# Patient Record
Sex: Male | Born: 1960 | Race: White | Hispanic: Yes | Marital: Married | Smoking: Current every day smoker
Health system: Southern US, Community
[De-identification: ages and names within clinical notes are randomized; demographics above are authoritative.]

## PROBLEM LIST (undated history)

## (undated) HISTORY — PX: APPENDECTOMY: SHX54

---

## 2005-01-13 ENCOUNTER — Ambulatory Visit: Payer: Self-pay | Admitting: Internal Medicine

## 2005-01-16 ENCOUNTER — Ambulatory Visit: Payer: Self-pay | Admitting: Internal Medicine

## 2006-07-26 ENCOUNTER — Emergency Department: Payer: Self-pay | Admitting: Emergency Medicine

## 2006-07-26 ENCOUNTER — Other Ambulatory Visit: Payer: Self-pay

## 2008-05-09 ENCOUNTER — Emergency Department: Payer: Self-pay | Admitting: Emergency Medicine

## 2012-09-23 ENCOUNTER — Observation Stay: Payer: Self-pay | Admitting: Surgery

## 2012-09-23 LAB — CBC
HCT: 42.5 % (ref 40.0–52.0)
MCHC: 34.2 g/dL (ref 32.0–36.0)
Platelet: 266 10*3/uL (ref 150–440)
RBC: 5.19 10*6/uL (ref 4.40–5.90)
WBC: 13.5 10*3/uL — ABNORMAL HIGH (ref 3.8–10.6)

## 2012-09-23 LAB — URINALYSIS, COMPLETE
Bacteria: NONE SEEN
Bilirubin,UR: NEGATIVE
Blood: NEGATIVE
Glucose,UR: NEGATIVE mg/dL (ref 0–75)
Ketone: NEGATIVE
Leukocyte Esterase: NEGATIVE
Nitrite: NEGATIVE
Specific Gravity: 1.018 (ref 1.003–1.030)
Squamous Epithelial: NONE SEEN
WBC UR: <1 /HPF (ref 0–5)

## 2012-09-23 LAB — LIPASE, BLOOD: Lipase: 93 U/L (ref 73–393)

## 2012-09-23 LAB — COMPREHENSIVE METABOLIC PANEL
BUN: 14 mg/dL (ref 7–18)
Bilirubin,Total: 0.6 mg/dL (ref 0.2–1.0)
Chloride: 107 mmol/L (ref 98–107)
Co2: 25 mmol/L (ref 21–32)
Creatinine: 0.65 mg/dL (ref 0.60–1.30)
EGFR (African American): >60
EGFR (Non-African Amer.): >60
Osmolality: 276 (ref 275–301)
Potassium: 3.8 mmol/L (ref 3.5–5.1)
SGOT(AST): 30 U/L (ref 15–37)
SGPT (ALT): 34 U/L (ref 12–78)
Sodium: 138 mmol/L (ref 136–145)
Total Protein: 8 g/dL (ref 6.4–8.2)

## 2012-09-24 LAB — CBC WITH DIFFERENTIAL/PLATELET
Basophil #: 0 10*3/uL (ref 0.0–0.1)
Eosinophil #: 0 10*3/uL (ref 0.0–0.7)
Eosinophil %: 0.3 %
HCT: 39.7 % — ABNORMAL LOW (ref 40.0–52.0)
HGB: 13.6 g/dL (ref 13.0–18.0)
Lymphocyte %: 13.7 %
MCHC: 34.2 g/dL (ref 32.0–36.0)
Monocyte %: 5.6 %
Neutrophil #: 8.5 10*3/uL — ABNORMAL HIGH (ref 1.4–6.5)
Neutrophil %: 80.1 %
RBC: 4.8 10*6/uL (ref 4.40–5.90)
RDW: 14 % (ref 11.5–14.5)
WBC: 10.6 10*3/uL (ref 3.8–10.6)

## 2012-09-24 LAB — BASIC METABOLIC PANEL
Anion Gap: 7 (ref 7–16)
BUN: 8 mg/dL (ref 7–18)
Chloride: 105 mmol/L (ref 98–107)
Co2: 25 mmol/L (ref 21–32)
Creatinine: 0.81 mg/dL (ref 0.60–1.30)
Potassium: 4 mmol/L (ref 3.5–5.1)
Sodium: 137 mmol/L (ref 136–145)

## 2012-09-27 LAB — PATHOLOGY REPORT

## 2014-08-07 ENCOUNTER — Emergency Department: Payer: Self-pay | Admitting: Emergency Medicine

## 2014-08-22 ENCOUNTER — Ambulatory Visit: Payer: Self-pay | Admitting: Family Medicine

## 2015-02-27 NOTE — H&P (Signed)
PATIENT NAME:  John Robles, John Robles MR#:  [id number] DATE OF BIRTH:  1960-12-27  DATE OF ADMISSION:  09/23/2012  CHIEF COMPLAINT: Left flank pain.   HISTORY OF PRESENT ILLNESS: This is a 54 year old Hispanic male patient who I was asked to see for left flank pain, but a CT is suggestive of appendicitis. Dr. Clemens Catholicagsdale asked me to see the patient, I spoke to her directly. The patient describes the acute onset of left flank pain that started yesterday. The pain was similar but not identical to a history of kidney stones many years ago. He has had no hematuria, no melena or hematochezia, is slightly nauseated but is hungry, and denies any right lower quadrant pain. He has never had an episode like this before and this is slightly similar to but not identical to prior kidney stone disease.   As mentioned, Dr. Clemens Catholicagsdale had done a CT scan with stone protocol and surprisingly showed right lower quadrant inflammation and dilatation of the appendix suggesting appendicitis.   PAST MEDICAL HISTORY: Kidney stones.   PAST SURGICAL HISTORY: None.   ALLERGIES: No known drug allergies.   MEDICATIONS: None.   FAMILY HISTORY: Noncontributory.   SOCIAL HISTORY: The patient does not smoke or drink.  REVIEW OF SYSTEMS: A 10 system review is performed and negative with the exception of that mentioned in the history of present illness.   PHYSICAL EXAMINATION:   GENERAL: Obese, Hispanic male patient.   VITAL SIGNS: Temperature 98.1, pulse 51, respirations 16, blood pressure 131/87, and 99% room air saturation.  HEENT: No scleral icterus.   NECK: No palpable neck nodes.   CHEST: Clear to auscultation.   CARDIAC: Regular rate and rhythm.   ABDOMEN: Soft, nondistended, and minimally tender in the left flank and left lateral abdomen. No right lower quadrant tenderness, no guarding, no rebound, and no percussion tenderness.   LABS/RADIOLOGIC STUDIES: Electrolytes are within normal limits. White blood  cell count is 13.5 and hemoglobin and hematocrit 14 and 42.   CT scan is personally reviewed, suggestive of appendicitis.   ASSESSMENT AND PLAN: This is a patient with left flank pain and CT findings of right lower quadrant acute appendicitis. The physical picture history of this patient does not fit appendicitis; however, the CT scan is quite convincing. He does have a leukocytosis. My plan would be to admit this patient to the hospital and observe him. If his pain complex becomes more suspicious for appendicitis in conjunction with the CT scan, then laparoscopy would be indicated. I will reexamine the patient and hydrate him and start antibiotics at this time because of the leukocytosis.  ____________________________ Adah Salvageichard E. Excell Seltzerooper, MD rec:slb D: 09/23/2012 14:32:45 ET T: 09/23/2012 14:48:51 ET JOB#: [id number]  cc: Adah Salvageichard E. Excell Seltzerooper, MD, <Dictator> Lattie HawICHARD E Alany Borman MD ELECTRONICALLY SIGNED 09/24/2012 14:34

## 2015-02-27 NOTE — Op Note (Signed)
PATIENT NAME:  John Robles [place], John Robles MR#:  [id number] DATE OF BIRTH:  [birth year]  DATE OF PROCEDURE:  09/23/2012  PREOPERATIVE DIAGNOSIS: Acute appendicitis.   POSTOPERATIVE DIAGNOSIS: Acute appendicitis.   PROCEDURE: Laparoscopic appendectomy.   SURGEON: Joash Tony E. Excell Seltzerooper, MD  ANESTHESIA: General with endotracheal tube.   INDICATIONS: This is a patient with progressive right lower quadrant pain and tenderness with signs of peritoneal irritation with leukocytosis and a CT suggesting appendicitis. Preoperatively we discussed rationale for surgery, the options of observation, risks of bleeding, infection, recurrence of symptoms, failure to resolve the symptoms, open procedure and a negative laparoscopy. This was reviewed for him. They understood and agreed to proceed.   FINDINGS: Acute appendicitis, suppurative, nonruptured.   DESCRIPTION OF PROCEDURE: Patient was induced to general anesthesia. He was on IV antibiotics, prepped and draped in sterile fashion. A Foley catheter was placed as well. Local anesthetic was infiltrated in skin and subcutaneous tissues around the periumbilical, incision was made. Veress needle was placed. Pneumoperitoneum was obtained. 5 mm trocar port was placed. The abdominal cavity was explored and under direct vision a 10 mm left lateral port was placed at a 5 mm suprapubic port. The appendix was identified in the right lower quadrant, elevated. The base of the appendix was handled with an Endo GIA and then two firings of the vascular load Endo GIA was fired across the mesoappendix. Specimen was passed out through the lateral port site with the aid of an Endo Catch bag. The area was checked for hemostasis, found to be adequate. There was no sign of bleeding or bile injury therefore the left lateral port site was closed under direct vision with multiple Endo Close technique of 0 Vicryl. Pneumoperitoneum was released. All ports removed. 4-0 subcuticular Monocryl was  used on all skin edges. Steri-Strips, Mastisol, and sterile dressings were placed.    Patient tolerated the procedure well. There were no complications. He was taken to the recovery room in stable condition to be admitted for continued care.   ____________________________ Adah Salvageichard E. Excell Seltzerooper, MD rec:cms D: 09/23/2012 19:20:45 ET T: 09/24/2012 09:36:27 ET JOB#: [id number]  cc: Adah Salvageichard E. Excell Seltzerooper, MD, <Dictator>  Lattie HawICHARD E Sui Kasparek MD ELECTRONICALLY SIGNED 09/24/2012 14:34

## 2015-02-27 NOTE — Discharge Summary (Signed)
PATIENT NAME:  John Robles, John Robles MR#:  [id number] DATE OF BIRTH:  1961/06/03  DATE OF ADMISSION:  09/23/2012 DATE OF DISCHARGE:  09/24/2012  CHIEF COMPLAINT: Right lower quadrant pain.   DIAGNOSIS: Acute appendicitis.   PROCEDURE: Laparoscopic appendectomy.   HISTORY OF PRESENT ILLNESS: This is a patient who presented to the Emergency Room with an unusual history of left flank pain and was having a remote past medical history of kidney stones. A stone protocol CT scan was performed anticipating left-sided kidney stones. Instead classic appendicitis was identified on the right. He was admitted to the hospital for observation and serial examination and throughout the day his pain and abdominal exam became more consistent with appendicitis.   He was taken to the Operating Room where a laparoscopic appendectomy confirmed the presence of acute suppurative appendicitis which was not ruptured. He underwent laparoscopic appendectomy, made an uncomplicated postoperative recovery, is tolerating a regular diet. He has instructions to remove his dressings tomorrow and shower tomorrow, leaving the Steri-Strips in place. He is given Vicodin for pain and instructions to follow up in my office in 10 days.  ____________________________ Adah Salvageichard E. Excell Seltzerooper, MD rec:cms D: 09/24/2012 14:22:59 ET T: 09/25/2012 15:24:48 ET JOB#: [id number]  cc: Adah Salvageichard E. Excell Seltzerooper, MD, <Dictator>  Lattie HawICHARD E Rozalia Dino MD ELECTRONICALLY SIGNED 09/26/2012 12:23

## 2015-07-30 ENCOUNTER — Emergency Department
Admission: EM | Admit: 2015-07-30 | Discharge: 2015-07-30 | Disposition: A | Discharge status: Home / Self Care | Payer: Medicaid Other | Attending: Emergency Medicine | Admitting: Emergency Medicine

## 2015-07-30 ENCOUNTER — Encounter: Payer: Self-pay | Admitting: Emergency Medicine

## 2015-07-30 DIAGNOSIS — Z23 Encounter for immunization: Secondary | ICD-10-CM | POA: Diagnosis not present

## 2015-07-30 DIAGNOSIS — Y998 Other external cause status: Secondary | ICD-10-CM | POA: Insufficient documentation

## 2015-07-30 DIAGNOSIS — Y9289 Other specified places as the place of occurrence of the external cause: Secondary | ICD-10-CM | POA: Diagnosis not present

## 2015-07-30 DIAGNOSIS — S0181XA Laceration without foreign body of other part of head, initial encounter: Secondary | ICD-10-CM

## 2015-07-30 DIAGNOSIS — Z720 Tobacco use: Secondary | ICD-10-CM | POA: Insufficient documentation

## 2015-07-30 DIAGNOSIS — Y9389 Activity, other specified: Secondary | ICD-10-CM | POA: Insufficient documentation

## 2015-07-30 DIAGNOSIS — W228XXA Striking against or struck by other objects, initial encounter: Secondary | ICD-10-CM | POA: Diagnosis not present

## 2015-07-30 MED ORDER — LIDOCAINE HCL (PF) 1 % IJ SOLN: 30.0000 mL | Freq: Once | INTRAMUSCULAR | Status: DC

## 2015-07-30 MED ORDER — LIDOCAINE-EPINEPHRINE (PF) 1 %-1:200000 IJ SOLN
INTRAMUSCULAR | Status: AC
  Filled 2015-07-30: qty 30

## 2015-07-30 MED ORDER — TETANUS-DIPHTH-ACELL PERTUSSIS 5-2.5-18.5 LF-MCG/0.5 IM SUSP
0.5000 mL | Freq: Once | INTRAMUSCULAR | Status: DC
  Filled 2015-07-30: qty 0.5

## 2015-07-30 NOTE — Discharge Instructions (Signed)
Laceracin facial (Facial Laceration)  Una laceracin facial es un corte en el rostro. Estas lesiones pueden ser dolorosas y [place]. Generalmente se curan rpido, pero puede ser necesario un cuidado especial para reducir las cicatrices. DIAGNSTICO  Su mdico realizar la historia clnica, preguntar detalles sobre cmo ocurri la lesin y examinar la herida para determinar cun profundo es el corte. TRATAMIENTO  Algunas laceraciones faciales no requieren sutura. Otras laceraciones quizs no puedan cerrarse debido a un aumento del riesgo de infeccin. El riesgo de infeccin y la probabilidad de que la herida se cierre correctamente dependern de diversos factores, incluido el tiempo transcurrido desde que ocurri la lesin. La herida puede limpiarse para ayudar a prevenir una infeccin. Si la herida se cierra adecuadamente, podrn indicarle analgsicos, si los necesita. Su mdico usar puntos (suturas), pegamento para heridas Sherlyn Lees) o tiras [phone] para la piel para Equities trader. Estos elementos mantendrn unidos los bordes de la piel para que se cure ms rpidamente y para Therapist, music un mejor resultado cosmtico. Si es necesario, es posible que le administren una vacuna contra el ttanos. [place] solo medicamentos de venta libre o recetados, segn las indicaciones del mdico.  Siga las indicaciones de su mdico para el cuidado de la herida. Estas indicaciones variarn segn la tcnica [place] para cerrar la herida. Si tiene suturas:  Mantenga la herida limpia y [place].  Si le colocaron una venda (vendaje), debe cambiarla al menos una vez al da. Adems, cambie el vendaje si este se moja o se ensucia, o segn las instrucciones de su [place] veces por da con agua y [place]. Enjuguela con agua para quitar todo el [place]. Seque dando palmaditas con una toalla limpia y seca.  Despus de limpiar, aplique una delgada capa  del ungento con antibitico recomendado por su mdico. Esto le ayudar a prevenir las infecciones y a Product/process development scientist que el vendaje se Designer, fashion/clothing.  Puede ducharse despus de las primeras 24 horas. No moje la herida hasta que le hayan quitado las suturas.  Qutese las suturas segn las indicaciones de su mdico. Con las laceraciones faciales, las suturas normalmente deben retirarse despus de 4 a 5das para evitar las marcas de los puntos.  Espere algunos [employer] de que le hayan retirado las suturas antes de Clinical cytogeneticist. En caso que tenga tiras [id number] para la piel:  Mantenga la herida limpia y seca.  No deje que las tiras adhesivas se mojen. Puede darse un bao pero asegrese de que la herida se mantenga seca.  Si se moja, squela dando palmaditas con una toalla limpia.  Las tiras caern por s mismas. Puede recortar las tiras a medida que la herida se [place]. No quite las tiras [place] para la piel que an estn adheridas a la herida. Ellas se caern cuando sea el momento. En caso que le hayan aplicado adhesivo:  Podr mojar la herida solo por un memento, en la ducha o el bao. No frote ni sumerja la herida. No practique natacin. Evite transpirar con abundancia hasta que el [place] para la piel se haya cado solo. Despus de ducharse o darse un bao, seque el corte dando palmaditas con una toalla limpia.  No aplique medicamentos lquidos, en crema o ungentos ni maquillaje en su herida mientras el [employer] para la piel est en su lugar. Podr aflojarlo antes de que la herida se cure.  Si tiene un vendaje, tenga cuidado de no aplicar [place]  directamente sobre el adhesivo. Esto puede hacer que el adhesivo se caiga antes de que la herida se haya curado. °· Evite la exposición prolongada a la luz solar o a las lámparas solares mientras el adhesivo para la piel se encuentre en el lugar. °· Por lo general, el adhesivo para la piel permanecerá en su lugar durante 5 a 10 días y luego  caerá naturalmente. No quite la película de adhesivo. °Después de la curación: °Una vez que la herida se haya curado, proteja la herida del sol durante un año, colocando pantalla solar durante el día. Esto puede ayudar a reducir las cicatrices. La exposición a los rayos ultravioletas durante el primer año oscurecerá la cicatriz. Pueden transcurrir entre uno y dos años hasta que la cicatriz se cure completamente y pierda el color rojo.  °SOLICITE ATENCIÓN MÉDICA DE INMEDIATO SI: °· Tiene enrojecimiento, dolor o hinchazón alrededor de la herida. °· Observa una secreción de color blanco amarillento (pus) en la herida. °· Tiene escalofríos o fiebre. °ASEGÚRESE DE QUE: °· Comprende estas instrucciones. °· Controlará su afección. °· Recibirá ayuda de inmediato si no mejora o si empeora. °Document Released: 10/27/2005 Document Revised: 08/17/2013 °ExitCare® Patient Information ©2015 [employer]. This information is not intended to replace advice given to you by your health care provider. Make sure you discuss any questions you have with your health care provider. ° °

## 2015-07-30 NOTE — ED Provider Notes (Signed)
[facility] Emergency Department Provider Note  ____________________________________________  Time seen: On arrival  I have reviewed the triage vital signs and the nursing notes.   HISTORY  Chief Complaint Facial Laceration    HPI John Robles is a 54 y.o. male who presents with a laceration to the left forehead. He was struck by a Merchant navy officer door coming down. No loss of consciousness. No focal deficits. Bleeding controlled. Tetanus not up-to-date    History reviewed. No pertinent past medical history.  There are no active problems to display for this patient.   No past surgical history on file.  No current outpatient prescriptions on file.  Allergies Review of patient's allergies indicates no known allergies.  No family history on file.  Social History Social History  Substance Use Topics  . Smoking status: Current Every Day Smoker  . Smokeless tobacco: None  . Alcohol Use: No    Review of Systems  Constitutional: Negative for fever. Eyes: Negative for visual changes.  Musculoskeletal: Negative for back pain. Skin: Negative for rash. Positive for laceration Neurological: Negative for headaches or focal weakness   ____________________________________________   PHYSICAL EXAM:  VITAL SIGNS: ED Triage Vitals  Enc Vitals Group     BP 07/30/15 1412 112/55 mmHg     Pulse Rate 07/30/15 1412 54     Resp 07/30/15 1412 14     Temp 07/30/15 1412 98.5 F (36.9 C)     Temp Source 07/30/15 1412 Oral     SpO2 07/30/15 1412 95 %     Weight 07/30/15 1412 210 lb (95.255 kg)     Height --      Head Cir --      Peak Flow --      Pain Score 07/30/15 1413 8     Pain Loc --      Pain Edu? --      Excl. in GC? --      Constitutional: Alert and oriented. Well appearing and in no distress. Eyes: Conjunctivae are normal.  ENT   Head: Normocephalic. Patient with 12 c m curvilinear laceration to the left forehead. Bleeding controlled. galea  seems intact   Mouth/Throat: Mucous membranes are moist. Cardiovascular: Normal rate, regular rhythm.  Respiratory: Normal respiratory effort without tachypnea nor retractions.  Gastrointestinal: Soft and non-tender in all quadrants. No distention. There is no CVA tenderness. Musculoskeletal: Nontender with normal range of motion in all extremities. Neurologic:  Normal speech and language. No gross focal neurologic deficits are appreciated. Cranial nerves 2 through 12 are normal Skin:  Skin is warm, dry and intact. No rash noted. Psychiatric: Mood and affect are normal. Patient exhibits appropriate insight and judgment.  ____________________________________________    LABS (pertinent positives/negatives)  Labs Reviewed - No data to display  ____________________________________________     ____________________________________________    RADIOLOGY I have personally reviewed any xrays that were ordered on this patient: None  ____________________________________________   PROCEDURES  Procedure(s) performed: yes  LACERATION REPAIR Performed by: Jene Every Authorized by: Jene Every Consent: Verbal consent obtained. Risks and benefits: risks, benefits and alternatives were discussed Consent given by: patient Patient identity confirmed: provided demographic data Prepped and Draped in normal sterile fashion Wound explored  Laceration Location: Left forehead  Laceration Length: 12 cm  No Foreign Bodies seen or palpated  Anesthesia: local infiltration  Local anesthetic: lidocaine 1 % with epinephrine  Anesthetic total: 6 ml  Irrigation method: syringe Amount of cleaning: standard  Skin closure: Sutures  Number of sutures: 12   Technique: Simple interrupted   Patient tolerance: Patient tolerated the procedure well with no immediate complications.    ____________________________________________   INITIAL IMPRESSION / ASSESSMENT AND PLAN / ED  COURSE  Pertinent labs & imaging results that were available during my care of the patient were reviewed by me and considered in my medical decision making (see chart for details).  Laceration repair by me. No loss of consciousness neuro deficits or headache and no imaging ordered. Tetanus given in the emergency department. Sutures need to be removed in 5-7 days  ____________________________________________   FINAL CLINICAL IMPRESSION(S) / ED DIAGNOSES  Final diagnoses:  Facial laceration, initial encounter     Jene Every, MD 07/30/15 1645

## 2015-07-30 NOTE — ED Notes (Signed)
TDAP given. Wound dressed using non-stick gauze and stretch gauze to wrap. Patient tolerated well

## 2015-07-30 NOTE — ED Notes (Signed)
Today was closing van door and it hit him in forehead.  No loc.  laceration

## 2015-08-04 ENCOUNTER — Emergency Department
Admission: EM | Admit: 2015-08-04 | Discharge: 2015-08-04 | Disposition: A | Discharge status: Home / Self Care | Payer: Medicaid Other | Attending: Emergency Medicine | Admitting: Emergency Medicine

## 2015-08-04 ENCOUNTER — Encounter: Payer: Self-pay | Admitting: Emergency Medicine

## 2015-08-04 DIAGNOSIS — Z4802 Encounter for removal of sutures: Secondary | ICD-10-CM

## 2015-08-04 DIAGNOSIS — Z720 Tobacco use: Secondary | ICD-10-CM | POA: Diagnosis not present

## 2015-08-04 MED ORDER — BACITRACIN ZINC 500 UNIT/GM EX OINT
TOPICAL_OINTMENT | CUTANEOUS | Status: AC
  Filled 2015-08-04: qty 0.9

## 2015-08-04 NOTE — ED Notes (Signed)
Patient here for suture removal. Denies any pain at this time.

## 2015-08-04 NOTE — ED Notes (Signed)
Pt discharged home after verbalizing understanding of discharge instructions; nad noted. 

## 2015-08-04 NOTE — ED Provider Notes (Signed)
[facility] Emergency Department Provider Note  ____________________________________________  Time seen: Approximately 3:54 PM  I have reviewed the triage vital signs and the nursing notes.   HISTORY  Chief Complaint Suture / Staple Removal  HPI John Robles is a 54 y.o. male is here to have sutures removed. He was seen in the emergency room on 9/19 for a laceration to his left forehead. He denies any headaches or visual changes. He  denies any fever or chills.He rates his pain is 1 out of 10.   History reviewed. No pertinent past medical history.  There are no active problems to display for this patient.   Past Surgical History  Procedure Laterality Date  . Appendectomy      No current outpatient prescriptions on file.  Allergies Review of patient's allergies indicates no known allergies.  No family history on file.  Social History Social History  Substance Use Topics  . Smoking status: Current Every Day Smoker -- .2 years  . Smokeless tobacco: None  . Alcohol Use: No    Review of Systems Constitutional: No fever/chills Eyes: No visual changes. Cardiovascular: Denies chest pain. Respiratory: Denies shortness of breath. Gastrointestinal:   No nausea, no vomiting.  Musculoskeletal: Negative for back pain. Skin: Negative for rash. Laceration forehead Neurological: Negative for headaches, focal weakness or numbness. 10-point ROS otherwise negative.  ____________________________________________   PHYSICAL EXAM:  VITAL SIGNS: ED Triage Vitals  Enc Vitals Group     BP 08/04/15 1539 123/67 mmHg     Pulse Rate 08/04/15 1539 66     Resp 08/04/15 1539 18     Temp 08/04/15 1539 98 F (36.7 C)     Temp Source 08/04/15 1539 Oral     SpO2 08/04/15 1539 96 %     Weight 08/04/15 1539 210 lb (95.255 kg)     Height 08/04/15 1539  (1.651 m)     Head Cir --      Peak Flow --      Pain Score 08/04/15 1536 1     Pain Loc --    Pain Edu? --      Excl. in GC? --     Constitutional: Alert and oriented. Well appearing and in no acute distress. Eyes: Conjunctivae are normal. PERRL. EOMI. Head: Atraumatic. Nose: No congestion/rhinnorhea. Neck: No stridor.   Marland KitchenRespiratory: Normal respiratory effort.  No retractions. Gastrointestinal: Soft and nontender. No distention. Musculoskeletal: No lower extremity tenderness nor edema.  No joint effusions. Neurologic:  Normal speech and language. No gross focal neurologic deficits are appreciated. No gait instability. Skin:  Skin is warm, dry and intact. Laceration to left forehead appears to be healing without any signs of infection. Psychiatric: Mood and affect are normal. Speech and behavior are normal.  ____________________________________________   LABS (all labs ordered are listed, but only abnormal results are displayed)  Labs Reviewed - No data to display  PROCEDURES  Procedure(s) performed: None  Critical Care performed: No  ____________________________________________   INITIAL IMPRESSION / ASSESSMENT AND PLAN / ED COURSE  Pertinent labs & imaging results that were available during my care of the patient were reviewed by me and considered in my medical decision making (see chart for details).  Sutures were removed without any difficulty. Patient is return to the emergency room if any urgent concerns. ____________________________________________   FINAL CLINICAL IMPRESSION(S) / ED DIAGNOSES  Final diagnoses:  Encounter for removal of sutures      Tommi Rumps, PA-C  08/04/15 2110  Rockne Menghini, MD 08/17/15 1610

## 2015-08-04 NOTE — ED Notes (Signed)
Patient presents to the ED for suture removal with sutures to his scalp.  Patient states sutures were placed on Monday.  Area appears to be healing well.  Patient reports minimal pain.  No obvious signs of infection.

## 2015-08-04 NOTE — Discharge Instructions (Signed)
Remoción de la sutura, cuidados posteriores °(Suture Removal, Care After) °Siga estas instrucciones durante las próximas semanas. Estas indicaciones le proporcionan información general acerca de cómo deberá cuidarse después del procedimiento. El médico también podrá darle instrucciones más específicas. El tratamiento se ha planificado de acuerdo a las prácticas médicas actuales, pero a veces se producen problemas. Comuníquese con el médico si tiene algún problema o tiene dudas después del procedimiento. °QUÉ ESPERAR DESPUÉS DEL PROCEDIMIENTO °Después de que le retiren los puntos (suturas), es normal experimentar lo siguiente: °· Molestias e hinchazón en la zona de la herida. °· Leve enrojecimiento en la zona de la herida. °INSTRUCCIONES PARA EL CUIDADO EN EL HOGAR  °· Si tiene bandas adhesivas en la piel sobre la zona de la herida, no las retire. Se caerán solas en unos pocos días. Si las bandas adhesivas siguen en su lugar después de 14 días, entonces puede retirarlas. °· Cambie los apósitos (vendajes), al menos, una vez al día o según las instrucciones del médico. Si el vendaje se adhiere, remójelo con agua jabonosa tibia. °· Solo aplique un ungüento o crema según las indicaciones del médico. Si usa crema o ungüento, lave la zona con agua y jabón dos veces por día para quitarlo todo. Enjuague el jabón y seque suavemente la zona con una toalla limpia. °· Mantenga la herida limpia y seca. Si el vendaje se moja, se ensucia o tiene mal olor, cámbielo tan pronto como pueda. °· Continúe protegiendo la herida de lesiones. °· Use protector solar cuando salga al sol. Las cicatrices nuevas se broncean fácilmente. °SOLICITE ATENCIÓN MÉDICA SI: °· Aumenta el enrojecimiento, la hinchazón o el dolor en la zona afectada. °· Observa que sale pus de la herida. °· Tiene fiebre. °· Advierte un olor fétido que proviene de la herida o del vendaje. °· La herida se abre (los bordes se separan). °Document Released: 08/06/2005 Document  Revised: 08/17/2013 °ExitCare® Patient Information ©2015 [employer]. This information is not intended to replace advice given to you by your health care provider. Make sure you discuss any questions you have with your health care provider. ° °

## 2016-05-08 ENCOUNTER — Encounter: Payer: Self-pay | Admitting: Emergency Medicine

## 2016-05-08 ENCOUNTER — Emergency Department: Payer: Self-pay

## 2016-05-08 ENCOUNTER — Emergency Department
Admission: EM | Admit: 2016-05-08 | Discharge: 2016-05-08 | Disposition: A | Discharge status: Home / Self Care | Payer: Self-pay | Attending: Emergency Medicine | Admitting: Emergency Medicine

## 2016-05-08 DIAGNOSIS — F17210 Nicotine dependence, cigarettes, uncomplicated: Secondary | ICD-10-CM | POA: Insufficient documentation

## 2016-05-08 DIAGNOSIS — N281 Cyst of kidney, acquired: Secondary | ICD-10-CM | POA: Insufficient documentation

## 2016-05-08 DIAGNOSIS — R109 Unspecified abdominal pain: Secondary | ICD-10-CM

## 2016-05-08 LAB — BASIC METABOLIC PANEL
Anion gap: 8 (ref 5–15)
BUN: 11 mg/dL (ref 6–20)
CALCIUM: 9.1 mg/dL (ref 8.9–10.3)
CO2: 24 mmol/L (ref 22–32)
CREATININE: 0.77 mg/dL (ref 0.61–1.24)
Chloride: 103 mmol/L (ref 101–111)
GFR calc Af Amer: >60 mL/min (ref >60)
GFR calc non Af Amer: >60 mL/min (ref >60)
GLUCOSE: 93 mg/dL (ref 65–99)
Potassium: 3.7 mmol/L (ref 3.5–5.1)
Sodium: 135 mmol/L (ref 135–145)

## 2016-05-08 LAB — CBC
HCT: 43.9 % (ref 40.0–52.0)
HEMOGLOBIN: 15.1 g/dL (ref 13.0–18.0)
MCH: 28.5 pg (ref 26.0–34.0)
MCHC: 34.5 g/dL (ref 32.0–36.0)
MCV: 82.7 fL (ref 80.0–100.0)
PLATELETS: 256 10*3/uL (ref 150–440)
RBC: 5.31 MIL/uL (ref 4.40–5.90)
RDW: 14.4 % (ref 11.5–14.5)
WBC: 9.5 10*3/uL (ref 3.8–10.6)

## 2016-05-08 LAB — URINALYSIS COMPLETE WITH MICROSCOPIC (ARMC ONLY)
BACTERIA UA: NONE SEEN
Bilirubin Urine: NEGATIVE
GLUCOSE, UA: NEGATIVE mg/dL
Ketones, ur: NEGATIVE mg/dL
Leukocytes, UA: NEGATIVE
Nitrite: NEGATIVE
PH: 5 (ref 5.0–8.0)
Protein, ur: NEGATIVE mg/dL
Specific Gravity, Urine: 1.008 (ref 1.005–1.030)

## 2016-05-08 MED ORDER — NAPROXEN 375 MG PO TABS: 375.0000 mg | ORAL_TABLET | Freq: Two times a day (BID) | ORAL | Status: AC | PRN

## 2016-05-08 MED ORDER — OXYCODONE-ACETAMINOPHEN 5-325 MG PO TABS
ORAL_TABLET | ORAL | Status: DC
Start: 2016-05-08 — End: 2016-05-09
  Filled 2016-05-08: qty 1

## 2016-05-08 MED ORDER — CARISOPRODOL 350 MG PO TABS: 350.0000 mg | ORAL_TABLET | Freq: Three times a day (TID) | ORAL | Status: DC | PRN

## 2016-05-08 MED ORDER — OXYCODONE-ACETAMINOPHEN 5-325 MG PO TABS
1.0000 | ORAL_TABLET | Freq: Once | ORAL | Status: AC
  Administered 2016-05-08: 1 via ORAL

## 2016-05-08 NOTE — ED Provider Notes (Signed)
[facility] Emergency Department Provider Note   ____________________________________________  Time seen: Approximately 8 PM  I have reviewed the triage vital signs and the nursing notes.   HISTORY  Chief Complaint Flank Pain   HPI John Robles is a 55 y.o. male with a history of kidney stones as well as an appendectomy was presented to the emergency department today for left-sided and sudden onset flank pain at 2 PM. He says that he was not lifting and denies any injury. Says that the pain is sharp and is a 6 out of 10. Says that it is a "hard pain." He says that he has had a kidney stone in the past but was many years ago and he says that it was much worse than this. He denies any burning with urination. No nausea, no vomiting or diarrhea. Denies any shortness of breath or chest pain.Says that the pain worsens with movement and twisting of his torso.   History reviewed. No pertinent past medical history.  There are no active problems to display for this patient.   Past Surgical History  Procedure Laterality Date  . Appendectomy      No current outpatient prescriptions on file.  Allergies Review of patient's allergies indicates no known allergies.  History reviewed. No pertinent family history.  Social History Social History  Substance Use Topics  . Smoking status: Current Every Day Smoker -- .2 years    Types: Cigarettes  . Smokeless tobacco: Never Used  . Alcohol Use: No    Review of Systems Constitutional: No fever/chills Eyes: No visual changes. ENT: No sore throat. Cardiovascular: Denies chest pain. Respiratory: Denies shortness of breath. Gastrointestinal: No abdominal pain.  No nausea, no vomiting.  No diarrhea.  No constipation. Genitourinary: Negative for dysuria. Musculoskeletal: Negative for back pain. Skin: Negative for rash. Neurological: Negative for headaches, focal weakness or numbness.  10-point ROS otherwise  negative.  ____________________________________________   PHYSICAL EXAM:  VITAL SIGNS: ED Triage Vitals  Enc Vitals Group     BP 05/08/16 1829 115/68 mmHg     Pulse Rate 05/08/16 1829 67     Resp 05/08/16 1829 20     Temp 05/08/16 1829 98 F (36.7 C)     Temp Source 05/08/16 1829 Oral     SpO2 05/08/16 1829 96 %     Weight 05/08/16 1829 210 lb (95.255 kg)     Height 05/08/16 1829  (1.626 m)     Head Cir --      Peak Flow --      Pain Score 05/08/16 1829 6     Pain Loc --      Pain Edu? --      Excl. in GC? --     Constitutional: Alert and oriented. Well appearing and in no acute distress. Eyes: Conjunctivae are normal. PERRL. EOMI. Head: Atraumatic. Nose: No congestion/rhinnorhea. Mouth/Throat: Mucous membranes are moist.   Neck: No stridor.   Cardiovascular: Normal rate, regular rhythm. Grossly normal heart sounds.  Respiratory: Normal respiratory effort.  No retractions. Lungs CTAB. Gastrointestinal: Soft and nontender. No distention.  Tenderness to palpation over the left lateral flank. No CVA tenderness bilaterally. Musculoskeletal: No lower extremity tenderness nor edema.  No joint effusions. Neurologic:  Normal speech and language. No gross focal neurologic deficits are appreciated. No gait instability. Skin:  Skin is warm, dry and intact. No rash noted. Psychiatric: Mood and affect are normal. Speech and behavior are normal.  ____________________________________________  LABS (all labs ordered are listed, but only abnormal results are displayed)  Labs Reviewed  URINALYSIS COMPLETEWITH MICROSCOPIC ([facility] ONLY) - Abnormal; Notable for the following:    Color, Urine STRAW (*)    APPearance CLEAR (*)    Hgb urine dipstick 1+ (*)    Squamous Epithelial / LPF 0-5 (*)    All other components within normal limits  BASIC METABOLIC PANEL  CBC    ____________________________________________  EKG   ____________________________________________  RADIOLOGY  CT RENAL STONE STUDY (Final result) Result time: 05/08/16 20:15:06   Final result by Rad Results In Interface (05/08/16 20:15:06)   Narrative:   CLINICAL DATA: 55 year old male with left flank pain  EXAM: CT ABDOMEN AND PELVIS WITHOUT CONTRAST  TECHNIQUE: Multidetector CT imaging of the abdomen and pelvis was performed following the standard protocol without IV contrast.  COMPARISON: CT dated 09/23/2012  FINDINGS: Evaluation of this exam is limited in the absence of intravenous contrast.  None the visualized lung bases are clear. No intra-abdominal free air or free fluid.  The liver, gallbladder, pancreas, spleen, adrenal glands appear unremarkable. Multiple bilateral renal hypodense lesions measuring up to 3 cm in the interpolar aspect of the left kidney are not well evaluated but may represent cysts. Nonemergent ultrasound is recommended for further characterization. There is a 7 mm high attenuating parenchymal focus in the upper pole of the right kidney which may represent a complex cyst or a focus of scarring. There is no hydronephrosis or nephrolithiasis on either side. The visualized ureters appear unremarkable. The urinary bladder is predominantly collapsed. There is apparent diffuse thickening of the bladder wall which may be partly related to underdistention. Cystitis is not excluded. Correlation with urinalysis recommended. The prostate and seminal vesicles are grossly unremarkable.  There is sigmoid diverticulosis with muscular hypertrophy. No active inflammatory changes noted. Moderate stool noted within the colon. There is no evidence of bowel obstruction or active inflammation. Appendectomy.  The abdominal aorta and IVC appear grossly unremarkable on this noncontrast study. All there is a retro aortic left renal vein anatomy. No portal  venous gas identified. There is no adenopathy. There is a small fat containing umbilical hernia. The abdominal wall soft tissues are otherwise unremarkable. The osseous structures are intact.  IMPRESSION: No acute intra-abdominal or pelvic pathology. Specifically there is no hydronephrosis or nephrolithiasis.  This bilateral renal hypodense lesions as well as a 7 mm high attenuating right renal upper pole parenchymal focus. Nonemergent ultrasound is recommended for further evaluation.  Sigmoid diverticulosis. No evidence of bowel obstruction or active inflammation.   Electronically Signed By: Elgie CollardArash Radparvar M.D. On: 05/08/2016 20:15       ____________________________________________   PROCEDURES ____________________________________________   INITIAL IMPRESSION / ASSESSMENT AND PLAN / ED COURSE  Pertinent labs & imaging results that were available during my care of the patient were reviewed by me and considered in my medical decision making (see chart for details).  ----------------------------------------- 8:54 PM on 05/08/2016 -----------------------------------------  Patient resting comfortably. He says that his pain is improved after the Percocet. He has what appear to be multiple renal cysts bilaterally measuring up to 3 cm. It is possible that he is having pain from the cysts. However, the sudden onset as well as the reproducibility immediately that it may be musculoskeletal as well. I will give him Naprosyn as well as Soma. I will give him follow-up with primary care as well as urology. He does not have a primary care doctor. He is aware that the  medicine may make him sleepy and will not be driving or operating any heavy machinery while taking the muscle relaxer. He understands the plan and is willing to comply. ____________________________________________   FINAL CLINICAL IMPRESSION(S) / ED DIAGNOSES  Final diagnoses:  Left flank pain  Renal  cysts.    NEW MEDICATIONS STARTED DURING THIS VISIT:  New Prescriptions   No medications on file     Note:  This document was prepared using Dragon voice recognition software and may include unintentional dictation errors.    Myrna Blazeravid Matthew Jory Welke, MD 05/08/16 2055

## 2016-05-08 NOTE — ED Notes (Signed)
Pt c/o L flank pain that started today at approx 1230. Pt states hx of kidney stones. Pt denies urinary symptoms at this time. Describes the pain as "a hard pain" at this time. NAD noted at this time. Respirations even and unlabored at this time.

## 2016-05-08 NOTE — ED Notes (Signed)
H/o kidney stones back in 1996.  Patient describes pain as a "hard pain".  Patient in NAd at this time.

## 2016-05-08 NOTE — Discharge Instructions (Signed)
Dolor en el flanco  °(Flank Pain) °El dolor en el flanco se refiere a aquel dolor que se localiza en un lado del cuerpo, entre la zona superior del abdomen y la espalda. Puede aparecer en un período corto de tiempo (agudo) o puede durar mucho tiempo o ser recurrente (crónico). Puede ser leve o intenso. Puede tener diferentes causas.  °CAUSAS  °Algunas de las causas más comunes son:  °· Esguince muscular.   °· Espasmos musculares.   °· Problemas en la columna vertebral (enfermedad de los discos).   °· Infección en el pulmón (neumonía).   °· Líquido en los pulmones (edema pulmonar).   °· Infección renal.   °· Piedras en el riñón.   °· Una erupción cutánea muy dolorosa causada por el virus de la varicela (herpes zoster). °· Enfermedad de la vesícula biliar. °INSTRUCCIONES PARA EL CUIDADO EN EL HOGAR  °Los cuidados en el hogar dependerán de la causa del dolor. En general:  °· Haga reposo según las indicaciones del médico. °· Debe ingerir gran cantidad de líquido para mantener la orina de tono claro o color amarillo pálido. °· Tome sólo medicamentos de venta libre o recetados, según las indicaciones del médico. Algunos medicamentos ayudan a aliviar el dolor. °· Informe al médico si tiene algún cambio en el dolor. °· Concurra a las visitas de control, según las indicaciones. °SOLICITE ATENCIÓN MÉDICA DE INMEDIATO SI:  °· El dolor no se alivia con los medicamentos.   °· Tiene síntomas nuevos o que empeoran. °· El dolor aumenta.   °· Siente dolor abdominal.   °· Le falta el aire.   °· Tiene náuseas o vómitos persistentes.   °· El abdomen se hincha.   °· Sufre mareos o se desmaya.   °· Observa sangre en la orina. °· Tiene fiebre o síntomas persistentes durante más de 2 ó 3 días. °· Tiene fiebre y los síntomas empeoran repentinamente. °ASEGÚRESE DE QUE:  °· Comprende estas instrucciones. °· Controlará su enfermedad. °· Solicitará ayuda de inmediato si no mejora o si empeora. °  °Esta información no tiene como fin reemplazar  el consejo del médico. Asegúrese de hacerle al médico cualquier pregunta que tenga. °  °Document Released: 02/03/2008 Document Revised: 07/21/2012 °Elsevier Interactive Patient Education ©2016 [employer] Inc[employer] ° °

## 2017-11-28 ENCOUNTER — Other Ambulatory Visit: Payer: Self-pay

## 2017-11-28 ENCOUNTER — Encounter: Payer: Self-pay | Admitting: Emergency Medicine

## 2017-11-28 ENCOUNTER — Emergency Department
Admission: EM | Admit: 2017-11-28 | Discharge: 2017-11-28 | Disposition: A | Discharge status: Home / Self Care | Payer: Self-pay | Attending: Emergency Medicine | Admitting: Emergency Medicine

## 2017-11-28 DIAGNOSIS — Y92009 Unspecified place in unspecified non-institutional (private) residence as the place of occurrence of the external cause: Secondary | ICD-10-CM | POA: Insufficient documentation

## 2017-11-28 DIAGNOSIS — S39012A Strain of muscle, fascia and tendon of lower back, initial encounter: Secondary | ICD-10-CM | POA: Insufficient documentation

## 2017-11-28 DIAGNOSIS — X500XXA Overexertion from strenuous movement or load, initial encounter: Secondary | ICD-10-CM | POA: Insufficient documentation

## 2017-11-28 DIAGNOSIS — F17210 Nicotine dependence, cigarettes, uncomplicated: Secondary | ICD-10-CM | POA: Insufficient documentation

## 2017-11-28 DIAGNOSIS — Y9389 Activity, other specified: Secondary | ICD-10-CM | POA: Insufficient documentation

## 2017-11-28 DIAGNOSIS — Y998 Other external cause status: Secondary | ICD-10-CM | POA: Insufficient documentation

## 2017-11-28 MED ORDER — KETOROLAC TROMETHAMINE 30 MG/ML IJ SOLN
30.0000 mg | Freq: Once | INTRAMUSCULAR | Status: AC
  Administered 2017-11-28: 30 mg via INTRAMUSCULAR
  Filled 2017-11-28: qty 1

## 2017-11-28 MED ORDER — METHOCARBAMOL 500 MG PO TABS
1000.0000 mg | ORAL_TABLET | Freq: Once | ORAL | Status: AC
  Administered 2017-11-28: 1000 mg via ORAL
  Filled 2017-11-28: qty 2

## 2017-11-28 MED ORDER — MELOXICAM 15 MG PO TABS: 15.0000 mg | ORAL_TABLET | Freq: Every day | ORAL | 0 refills | Status: DC

## 2017-11-28 MED ORDER — METHOCARBAMOL 500 MG PO TABS: 500.0000 mg | ORAL_TABLET | Freq: Four times a day (QID) | ORAL | 0 refills | Status: DC

## 2017-11-28 NOTE — ED Notes (Signed)
Pt states picked up something and pulled his low back. Has not taken tylenol or motrin.

## 2017-11-28 NOTE — ED Triage Notes (Signed)
Pt states that he was at home today and was lifting a heavy object and hurt his back around 1500. Pt is ambulatory to triage and in NAD.

## 2017-11-28 NOTE — ED Provider Notes (Signed)
[facility] Emergency Department Provider Note  ____________________________________________  Time seen: Approximately 11:12 PM  I have reviewed the triage vital signs and the nursing notes.   HISTORY  Chief Complaint Back Pain    HPI John Robles is a 57 y.o. male who presents the emergency department for 3-day history of lower back pain.  Patient was doing some work at home, lifting a heavy object and felt a tight sensation in his lower back.  Initially, patient tight/burning sensation in his lower back.  Patient reports that it is worse with movement or twisting.  He denies any radicular symptoms.  No bowel or bladder dysfunction, saddle anesthesia, paresthesias.  No direct trauma.  No history of back problems.  No medications for this complaint prior to arrival.  Patient did try a homemade salve to the area which did not significantly improve symptoms.  History reviewed. No pertinent past medical history.  There are no active problems to display for this patient.   Past Surgical History:  Procedure Laterality Date  . APPENDECTOMY      Prior to Admission medications   Medication Sig Start Date End Date Taking? Authorizing Provider  carisoprodol (SOMA) 350 MG tablet Take 1 tablet (350 mg total) by mouth 3 (three) times daily as needed for muscle spasms. 05/08/16   Schaevitz, Myra Rudeavid Matthew, MD  meloxicam (MOBIC) 15 MG tablet Take 1 tablet (15 mg total) by mouth daily. 11/28/17   Hava Massingale, Delorise RoyalsJonathan D, PA-C  methocarbamol (ROBAXIN) 500 MG tablet Take 1 tablet (500 mg total) by mouth 4 (four) times daily. 11/28/17   Damein Gaunce, Delorise RoyalsJonathan D, PA-C    Allergies Patient has no known allergies.  No family history on file.  Social History Social History   Tobacco Use  . Smoking status: Current Every Day Smoker    Years: 0.20    Types: Cigarettes  . Smokeless tobacco: Never Used  Substance Use Topics  . Alcohol use: No  . Drug use: No     Review  of Systems  Constitutional: No fever/chills Eyes: No visual changes.  Cardiovascular: no chest pain. Respiratory: no cough. No SOB. Gastrointestinal: No abdominal pain.  No nausea, no vomiting.  No diarrhea.  No constipation. Genitourinary: Negative for dysuria. No hematuria Musculoskeletal: Positive for nonradiating lower back pain Skin: Negative for rash, abrasions, lacerations, ecchymosis. Neurological: Negative for headaches, focal weakness or numbness. 10-point ROS otherwise negative.  ____________________________________________   PHYSICAL EXAM:  VITAL SIGNS: ED Triage Vitals  Enc Vitals Group     BP 11/28/17 2105 121/71     Pulse Rate 11/28/17 2105 71     Resp 11/28/17 2105 18     Temp 11/28/17 2105 98.3 F (36.8 C)     Temp Source 11/28/17 2105 Oral     SpO2 11/28/17 2105 95 %     Weight 11/28/17 2106 205 lb (93 kg)     Height 11/28/17 2106 5\' 4"  (1.626 m)     Head Circumference --      Peak Flow --      Pain Score 11/28/17 2103 8     Pain Loc --      Pain Edu? --      Excl. in GC? --      Constitutional: Alert and oriented. Well appearing and in no acute distress. Eyes: Conjunctivae are normal. PERRL. EOMI. Head: Atraumatic. Neck: No stridor.    Cardiovascular: Normal rate, regular rhythm. Normal S1 and S2.  Good peripheral circulation. Respiratory: Normal  respiratory effort without tachypnea or retractions. Lungs CTAB. Good air entry to the bases with no decreased or absent breath sounds. Musculoskeletal: Full range of motion to all extremities. No gross deformities appreciated.  No deformities to spine upon inspection.  Full range of motion to the lumbar spine.  No tenderness to palpation midline spinal processes or bilateral paraspinal muscle groups.  Negative for tenderness to palpation over bilateral sciatic notches.  Negative straight leg raise bilaterally.  Dorsalis pedis pulse intact bilateral lower extremities.  Sensation intact and equal bilateral lower  extremities. Neurologic:  Normal speech and language. No gross focal neurologic deficits are appreciated.  Skin:  Skin is warm, dry and intact. No rash noted. Psychiatric: Mood and affect are normal. Speech and behavior are normal. Patient exhibits appropriate insight and judgement.   ____________________________________________   LABS (all labs ordered are listed, but only abnormal results are displayed)  Labs Reviewed - No data to display ____________________________________________  EKG   ____________________________________________  RADIOLOGY   No results found.  ____________________________________________    PROCEDURES  Procedure(s) performed:    Procedures    Medications  ketorolac (TORADOL) 30 MG/ML injection 30 mg (not administered)  methocarbamol (ROBAXIN) tablet 1,000 mg (not administered)     ____________________________________________   INITIAL IMPRESSION / ASSESSMENT AND PLAN / ED COURSE  Pertinent labs & imaging results that were available during my care of the patient were reviewed by me and considered in my medical decision making (see chart for details).  Review of the [place] CSRS was performed in accordance of the [facility] prior to dispensing any controlled drugs.     Patient's diagnosis is consistent with lumbosacral strain.  Patient with lower back pain after lifting heavy object.  No concerning findings of radicular symptoms, bowel or bladder suction, saddle anesthesia, paresthesias.  No indication at this time for labs or imaging.  Patient is given Toradol muscle relaxer in the emergency department.. Patient will be discharged home with prescriptions for meloxicam Robaxin. Patient is to follow up with primary care as needed or otherwise directed. Patient is given ED precautions to return to the ED for any worsening or new symptoms.     ____________________________________________  FINAL CLINICAL IMPRESSION(S) / ED DIAGNOSES  Final  diagnoses:  Strain of lumbar region, initial encounter      NEW MEDICATIONS STARTED DURING THIS VISIT:  ED Discharge Orders        Ordered    meloxicam (MOBIC) 15 MG tablet  Daily     11/28/17 2250    methocarbamol (ROBAXIN) 500 MG tablet  4 times daily     11/28/17 2250          This chart was dictated using voice recognition software/Dragon. Despite best efforts to proofread, errors can occur which can change the meaning. Any change was purely unintentional.    Racheal Patches, PA-C 11/28/17 2314    Emily Filbert, MD 11/28/17 [phone]

## 2018-08-17 ENCOUNTER — Emergency Department
Admission: EM | Admit: 2018-08-17 | Discharge: 2018-08-17 | Disposition: A | Discharge status: Home / Self Care | Payer: Medicaid Other | Attending: Emergency Medicine | Admitting: Emergency Medicine

## 2018-08-17 ENCOUNTER — Encounter: Payer: Self-pay | Admitting: Emergency Medicine

## 2018-08-17 ENCOUNTER — Emergency Department: Payer: Medicaid Other

## 2018-08-17 DIAGNOSIS — Z79899 Other long term (current) drug therapy: Secondary | ICD-10-CM | POA: Insufficient documentation

## 2018-08-17 DIAGNOSIS — M7732 Calcaneal spur, left foot: Secondary | ICD-10-CM

## 2018-08-17 DIAGNOSIS — F17210 Nicotine dependence, cigarettes, uncomplicated: Secondary | ICD-10-CM | POA: Insufficient documentation

## 2018-08-17 DIAGNOSIS — M25775 Osteophyte, left foot: Secondary | ICD-10-CM | POA: Insufficient documentation

## 2018-08-17 DIAGNOSIS — B353 Tinea pedis: Secondary | ICD-10-CM | POA: Insufficient documentation

## 2018-08-17 MED ORDER — TERBINAFINE HCL 1 % EX CREA: 1.0000 "application " | TOPICAL_CREAM | Freq: Two times a day (BID) | CUTANEOUS | 0 refills | Status: DC

## 2018-08-17 MED ORDER — NAPROXEN 500 MG PO TABS: 500.0000 mg | ORAL_TABLET | Freq: Two times a day (BID) | ORAL | Status: DC

## 2018-08-17 NOTE — ED Notes (Signed)
See triage note  Presents with pain to bottom of left foot for about 3 months  painnis worse in the mornings

## 2018-08-17 NOTE — ED Triage Notes (Signed)
Patient arrives via POV from home with c/o left foot pain x 3 months. Patient denies recent injury or trauma. 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 Patient reports the pain is only when he is up and walking. Patient ambulatory to triage with limping gait.

## 2018-08-17 NOTE — ED Provider Notes (Signed)
[facility] Emergency Department Provider Note   ____________________________________________   First MD Initiated Contact with Patient 08/17/18 1101     (approximate)  I have reviewed the triage vital signs and the nursing notes.   HISTORY  Chief Complaint Foot Pain    HPI John Robles is a 57 y.o. male patient presents with left foot pain for 3 months.  Patient denies provocative incident.  Patient did pain increased with prolonged standing and walking.  Patient is still requires prolonged standing which has increased over the past week.  Patient ambulates with atypical gait.  Patient rates pain a 7/10.  Patient described pain is "aching".  No palliative measure for complaint. History reviewed. No pertinent past medical history.  There are no active problems to display for this patient.   Past Surgical History:  Procedure Laterality Date  . APPENDECTOMY      Prior to Admission medications   Medication Sig Start Date End Date Taking? Authorizing Provider  carisoprodol (SOMA) 350 MG tablet Take 1 tablet (350 mg total) by mouth 3 (three) times daily as needed for muscle spasms. 05/08/16   Schaevitz, Myra Rude, MD  meloxicam (MOBIC) 15 MG tablet Take 1 tablet (15 mg total) by mouth daily. 11/28/17   Cuthriell, Delorise Royals, PA-C  methocarbamol (ROBAXIN) 500 MG tablet Take 1 tablet (500 mg total) by mouth 4 (four) times daily. 11/28/17   Cuthriell, Delorise Royals, PA-C  naproxen (NAPROSYN) 500 MG tablet Take 1 tablet (500 mg total) by mouth 2 (two) times daily with a meal. 08/17/18   Joni Reining, PA-C  terbinafine (LAMISIL AT) 1 % cream Apply 1 application topically 2 (two) times daily. 08/17/18   Joni Reining, PA-C    Allergies Patient has no known allergies.  No family history on file.  Social History Social History   Tobacco Use  . Smoking status: Current Every Day Smoker    Years: 0.20    Types: Cigarettes  . Smokeless tobacco: Never  Used  . Tobacco comment: "once or twice a day"  Substance Use Topics  . Alcohol use: Yes    Comment: "I drink twice a year"  . Drug use: No    Review of Systems Constitutional: No fever/chills Eyes: No visual changes. ENT: No sore throat. Cardiovascular: Denies chest pain. Respiratory: Denies shortness of breath. Gastrointestinal: No abdominal pain.  No nausea, no vomiting.  No diarrhea.  No constipation. Genitourinary: Negative for dysuria. Musculoskeletal: Left plantar foot pain. Skin: Rash bilateral foot Neurological: Negative for headaches, focal weakness or numbness.   ____________________________________________   PHYSICAL EXAM:  VITAL SIGNS: ED Triage Vitals  Enc Vitals Group     BP 08/17/18 1053 118/76     Pulse Rate 08/17/18 1053 60     Resp 08/17/18 1053 18     Temp 08/17/18 1053 98.2 F (36.8 C)     Temp Source 08/17/18 1053 Oral     SpO2 08/17/18 1053 97 %     Weight 08/17/18 1054 208 lb (94.3 kg)     Height 08/17/18 1054 5\' 5"  (1.651 m)     Head Circumference --      Peak Flow --      Pain Score 08/17/18 1054 7     Pain Loc --      Pain Edu? --      Excl. in GC? --    Constitutional: Alert and oriented. Well appearing and in no acute distress. Cardiovascular: Normal rate,  regular rhythm. Grossly normal heart sounds.  Good peripheral circulation. Respiratory: Normal respiratory effort.  No retractions. Lungs CTAB. Gastrointestinal: Soft and nontender. No distention. No abdominal bruits. No CVA tenderness. Musculoskeletal: No obvious deformity.  Patient is moderate guarding palpation plantar aspect of the calcaneus. Skin:  Skin is warm, dry and intact.  Scaly rash bilateral foot.   Psychiatric: Mood and affect are normal. Speech and behavior are normal.  ____________________________________________   LABS (all labs ordered are listed, but only abnormal results are displayed)  Labs Reviewed - No data to  display ____________________________________________  EKG   ____________________________________________  RADIOLOGY  ED MD interpretation:    Official radiology report(s): Dg Foot Complete Left  Result Date: 08/17/2018 CLINICAL DATA:  Left plantar foot pain for the past 3 months. No known injury. Pain is greatest with walking. EXAM: LEFT FOOT - COMPLETE 3+ VIEW COMPARISON:  None. FINDINGS: The bones are subjectively adequately mineralized. There is a plantar calcaneal spur. No acute or healing fracture is observed. There is no lytic nor blastic lesion. The joint spaces are well maintained. The soft tissues exhibit no abnormal swelling. IMPRESSION: Small plantar calcaneal spur. No acute bony abnormalities. The plantar soft tissues are unremarkable. Electronically Signed   By: David  [place] M.D.   On: 08/17/2018 11:42    ____________________________________________   PROCEDURES  Procedure(s) performed: None  Procedures  Critical Care performed: No  ____________________________________________   INITIAL IMPRESSION / ASSESSMENT AND PLAN / ED COURSE  As part of my medical decision making, I reviewed the following data within the electronic MEDICAL RECORD NUMBER   Patient presents with left plantar foot pain for approximately 3 months.  Patient also has a rash consistent with fungal infection of the feet.  X-rays reveal small heel spur.  Patient given discharge care instructions and advised to follow-up with podiatry if no improvement in 2 weeks.  Take medication as directed.       ____________________________________________   FINAL CLINICAL IMPRESSION(S) / ED DIAGNOSES  Final diagnoses:  Heel spur, left  Tinea pedis of both feet     ED Discharge Orders         Ordered    naproxen (NAPROSYN) 500 MG tablet  2 times daily with meals     08/17/18 1155    terbinafine (LAMISIL AT) 1 % cream  2 times daily     08/17/18 1155           Note:  This document was prepared  using Dragon voice recognition software and may include unintentional dictation errors.    Joni Reining, PA-C 08/17/18 1202    Emily Filbert, MD 08/17/18 1344

## 2018-08-17 NOTE — Discharge Instructions (Signed)
Purchase over-the-counter Dr. Margart Sickles heel spur and wear as directed.  Take medication as directed.  Advised to follow-up with podiatry clinic if no improvement in 2 weeks.

## 2018-09-21 ENCOUNTER — Emergency Department
Admission: EM | Admit: 2018-09-21 | Discharge: 2018-09-21 | Disposition: A | Discharge status: Home / Self Care | Payer: No Typology Code available for payment source | Attending: Emergency Medicine | Admitting: Emergency Medicine

## 2018-09-21 ENCOUNTER — Other Ambulatory Visit: Payer: Self-pay

## 2018-09-21 DIAGNOSIS — M7732 Calcaneal spur, left foot: Secondary | ICD-10-CM | POA: Diagnosis not present

## 2018-09-21 DIAGNOSIS — B353 Tinea pedis: Secondary | ICD-10-CM | POA: Diagnosis not present

## 2018-09-21 DIAGNOSIS — F17210 Nicotine dependence, cigarettes, uncomplicated: Secondary | ICD-10-CM | POA: Insufficient documentation

## 2018-09-21 DIAGNOSIS — M79672 Pain in left foot: Secondary | ICD-10-CM | POA: Diagnosis present

## 2018-09-21 DIAGNOSIS — Z79899 Other long term (current) drug therapy: Secondary | ICD-10-CM | POA: Diagnosis not present

## 2018-09-21 MED ORDER — NYSTATIN 100000 UNIT/GM EX CREA: 1.0000 "application " | TOPICAL_CREAM | Freq: Two times a day (BID) | CUTANEOUS | 0 refills | Status: DC

## 2018-09-21 MED ORDER — NYSTATIN 100000 UNIT/GM EX POWD: Freq: Four times a day (QID) | CUTANEOUS | 0 refills | Status: DC

## 2018-09-21 MED ORDER — NAPROXEN 500 MG PO TABS: 500.0000 mg | ORAL_TABLET | Freq: Two times a day (BID) | ORAL | Status: DC

## 2018-09-21 NOTE — ED Provider Notes (Signed)
[facility] Emergency Department Provider Note   ____________________________________________   First MD Initiated Contact with Patient 09/21/18 1104     (approximate)  I have reviewed the triage vital signs and the nursing notes.   HISTORY  Chief Complaint Foot Pain    HPI John Robles is a 57 y.o. male patient returns back for refill of medication for heel spur and tinea pedis.  Patient does not follow up with podiatry as directed.  Patient is not purchase the over-the-counter heel supports as directed.  Patient rates his pain as 8/10.  Patient described the pain is "achy".  History reviewed. No pertinent past medical history.  There are no active problems to display for this patient.   Past Surgical History:  Procedure Laterality Date  . APPENDECTOMY      Prior to Admission medications   Medication Sig Start Date End Date Taking? Authorizing Provider  carisoprodol (SOMA) 350 MG tablet Take 1 tablet (350 mg total) by mouth 3 (three) times daily as needed for muscle spasms. 05/08/16   Schaevitz, Myra Rude, MD  meloxicam (MOBIC) 15 MG tablet Take 1 tablet (15 mg total) by mouth daily. 11/28/17   Cuthriell, Delorise Royals, PA-C  methocarbamol (ROBAXIN) 500 MG tablet Take 1 tablet (500 mg total) by mouth 4 (four) times daily. 11/28/17   Cuthriell, Delorise Royals, PA-C  naproxen (NAPROSYN) 500 MG tablet Take 1 tablet (500 mg total) by mouth 2 (two) times daily with a meal. 08/17/18   Joni Reining, PA-C  naproxen (NAPROSYN) 500 MG tablet Take 1 tablet (500 mg total) by mouth 2 (two) times daily with a meal. 09/21/18   Joni Reining, PA-C  nystatin (MYCOSTATIN/NYSTOP) powder Apply topically 4 (four) times daily. 09/21/18   Joni Reining, PA-C  nystatin cream (MYCOSTATIN) Apply 1 application topically 2 (two) times daily. 09/21/18   Joni Reining, PA-C  terbinafine (LAMISIL AT) 1 % cream Apply 1 application topically 2 (two) times daily. 08/17/18    Joni Reining, PA-C    Allergies Patient has no known allergies.  No family history on file.  Social History Social History   Tobacco Use  . Smoking status: Current Every Day Smoker    Years: 0.20    Types: Cigarettes  . Smokeless tobacco: Never Used  . Tobacco comment: "once or twice a day"  Substance Use Topics  . Alcohol use: Yes    Comment: "I drink twice a year"  . Drug use: No    Review of Systems Constitutional: No fever/chills Eyes: No visual changes. ENT: No sore throat. Cardiovascular: Denies chest pain. Respiratory: Denies shortness of breath. Gastrointestinal: No abdominal pain.  No nausea, no vomiting.  No diarrhea.  No constipation. Genitourinary: Negative for dysuria. Musculoskeletal: Left heel pain. Skin: Fungal rash bilateral foot.  Neurological: Negative for headaches, focal weakness or numbness.   ____________________________________________   PHYSICAL EXAM:  VITAL SIGNS: ED Triage Vitals  Enc Vitals Group     BP 09/21/18 1048 107/63     Pulse Rate 09/21/18 1048 (!) 57     Resp 09/21/18 1048 16     Temp 09/21/18 1048 97.8 F (36.6 C)     Temp Source 09/21/18 1048 Oral     SpO2 09/21/18 1048 100 %     Weight 09/21/18 1049 207 lb 3.7 oz (94 kg)     Height 09/21/18 1049 5\' 4"  (1.626 m)     Head Circumference --  Peak Flow --      Pain Score 09/21/18 1049 8     Pain Loc --      Pain Edu? --      Excl. in GC? --    Constitutional: Alert and oriented. Well appearing and in no acute distress. Cardiovascular: Normal rate, regular rhythm. Grossly normal heart sounds.  Good peripheral circulation. Respiratory: Normal respiratory effort.  No retractions. Lungs CTAB. Neurologic:  Normal speech and language. No gross focal neurologic deficits are appreciated. No gait instability. Skin:  Skin is warm, dry and intact.  Flaky peeling skin bilateral foot.     ____________________________________________   LABS (all labs ordered are listed,  but only abnormal results are displayed)  Labs Reviewed - No data to display ____________________________________________  EKG   ____________________________________________  RADIOLOGY  ED MD interpretation:    Official radiology report(s): No results found.  ____________________________________________   PROCEDURES  Procedure(s) performed: None  Procedures  Critical Care performed: No  ____________________________________________   INITIAL IMPRESSION / ASSESSMENT AND PLAN / ED COURSE  As part of my medical decision making, I reviewed the following data within the electronic MEDICAL RECORD NUMBER    Patient presents for medication refill for heel spur and fungal infection of the feet.  Patient again advised to follow-up with podiatry for definitive evaluation and treatment.      ____________________________________________   FINAL CLINICAL IMPRESSION(S) / ED DIAGNOSES  Final diagnoses:  Heel spur, left  Tinea pedis of both feet     ED Discharge Orders         Ordered    naproxen (NAPROSYN) 500 MG tablet  2 times daily with meals     09/21/18 1121    nystatin cream (MYCOSTATIN)  2 times daily     09/21/18 1121    nystatin (MYCOSTATIN/NYSTOP) powder  4 times daily     09/21/18 1121           Note:  This document was prepared using Dragon voice recognition software and may include unintentional dictation errors.    Joni ReiningSmith, Ridhaan Dreibelbis K, PA-C 09/21/18 1127    Emily FilbertWilliams, Jonathan E, MD 09/24/18 [phone]

## 2018-09-21 NOTE — ED Triage Notes (Signed)
Pt came to ED via pov c/o left foot pain. Reports here for med refill. Unable to tell this RN what medication he needs refilled. HAs x-ray report in his hand. Ambulatory in triage.

## 2018-09-21 NOTE — Discharge Instructions (Signed)
Take medication as directed and follow-up with podiatry °

## 2018-09-21 NOTE — ED Notes (Addendum)
See triage note  Presents with left foot pain  Was seen last month for same  States he lost his discharge papers and did not know who to follow up with  And is out of pain meds

## 2019-06-06 ENCOUNTER — Other Ambulatory Visit: Payer: Self-pay

## 2019-06-06 DIAGNOSIS — Z20822 Contact with and (suspected) exposure to covid-19: Secondary | ICD-10-CM

## 2019-06-08 LAB — NOVEL CORONAVIRUS, NAA: SARS-CoV-2, NAA: NOT DETECTED

## 2020-07-19 ENCOUNTER — Encounter: Payer: Self-pay | Admitting: Emergency Medicine

## 2020-07-19 ENCOUNTER — Emergency Department: Payer: Self-pay

## 2020-07-19 ENCOUNTER — Emergency Department
Admission: EM | Admit: 2020-07-19 | Discharge: 2020-07-19 | Disposition: A | Discharge status: Home / Self Care | Payer: Self-pay | Attending: Emergency Medicine | Admitting: Emergency Medicine

## 2020-07-19 ENCOUNTER — Other Ambulatory Visit: Payer: Self-pay

## 2020-07-19 DIAGNOSIS — M25562 Pain in left knee: Secondary | ICD-10-CM | POA: Insufficient documentation

## 2020-07-19 DIAGNOSIS — F17210 Nicotine dependence, cigarettes, uncomplicated: Secondary | ICD-10-CM | POA: Insufficient documentation

## 2020-07-19 NOTE — ED Provider Notes (Signed)
[facility] Emergency Department Provider Note   ____________________________________________   First MD Initiated Contact with Patient 07/19/20 1339     (approximate)  I have reviewed the triage vital signs and the nursing notes.   HISTORY  Chief Complaint Knee Pain    HPI John Robles is a 59 y.o. male with no stated past medical history who presents for left knee pain for the past 2 months after going dancing.  Patient states that he has had anterior and medial left knee pain that has been intermittent and worsened with walking that he describes as aching, 5/10, nonradiating pain in this area.  Patient has not tried any medications to improve this pain except for topical "Mexican cream" with minimal pain relief.  Patient also states that he has kept this joint wrapped with an Ace wrap.  He does state that this Ace wrap improves his pain the most.  Patient denies any distal weakness/numbness/paresthesia.         History reviewed. No pertinent past medical history.  There are no problems to display for this patient.   Past Surgical History:  Procedure Laterality Date  . APPENDECTOMY      Prior to Admission medications   Medication Sig Start Date End Date Taking? Authorizing Provider  nystatin (MYCOSTATIN/NYSTOP) powder Apply topically 4 (four) times daily. 09/21/18   Joni Reining, PA-C  nystatin cream (MYCOSTATIN) Apply 1 application topically 2 (two) times daily. 09/21/18   Joni Reining, PA-C  terbinafine (LAMISIL AT) 1 % cream Apply 1 application topically 2 (two) times daily. 08/17/18   Joni Reining, PA-C    Allergies Patient has no known allergies.  No family history on file.  Social History Social History   Tobacco Use  . Smoking status: Current Every Day Smoker    Years: 0.20    Types: Cigarettes  . Smokeless tobacco: Never Used  . Tobacco comment: "once or twice a day"  Substance Use Topics  . Alcohol use: Yes      Comment: "I drink twice a year"  . Drug use: No    Review of Systems Constitutional: No fever/chills Eyes: No visual changes. ENT: No sore throat. Cardiovascular: Denies chest pain. Respiratory: Denies shortness of breath. Gastrointestinal: No abdominal pain.  No nausea, no vomiting.  No diarrhea. Genitourinary: Negative for dysuria. Musculoskeletal: Positive for left knee pain Skin: Negative for rash. Neurological: Negative for headaches, weakness/numbness/paresthesias in any extremity Psychiatric: Negative for suicidal ideation/homicidal ideation   ____________________________________________   PHYSICAL EXAM:  VITAL SIGNS: ED Triage Vitals  Enc Vitals Group     BP 07/19/20 1255 130/78     Pulse Rate 07/19/20 1255 (!) 59     Resp 07/19/20 1255 20     Temp 07/19/20 1255 98.5 F (36.9 C)     Temp Source 07/19/20 1255 Oral     SpO2 07/19/20 1255 95 %     Weight 07/19/20 1252 206 lb (93.4 kg)     Height 07/19/20 1252 5\' 4"  (1.626 m)     Head Circumference --      Peak Flow --      Pain Score 07/19/20 1252 5     Pain Loc --      Pain Edu? --      Excl. in GC? --     Constitutional: Alert and oriented. Well appearing and in no acute distress. Eyes: Conjunctivae are normal. PERRL. EOMI. Head: Atraumatic. Nose: No congestion/rhinnorhea. Mouth/Throat: Mucous membranes  are moist.  Oropharynx non-erythematous. Neck: No stridor Cardiovascular: Normal rate, regular rhythm. Grossly normal heart sounds.  Good peripheral circulation. Respiratory: Normal respiratory effort.  No retractions. Lungs CTAB. Gastrointestinal: Soft and nontender. No distention. No abdominal bruits. No CVA tenderness. Musculoskeletal: Left knee with tenderness on varus stress to the joint as well as palpation of the anterior plateau.  No joint effusions. Neurologic:  Normal speech and language. No gross focal neurologic deficits are appreciated. No gait instability. Skin:  Skin is warm, dry and  intact. No rash noted. Psychiatric: Mood and affect are normal. Speech and behavior are normal.  ____________________________________________   LABS (all labs ordered are listed, but only abnormal results are displayed)  Labs Reviewed - No data to display ____________________________________________ ____________________________________  RADIOLOGY  ED MD interpretation: 4 view x-ray of the left knee does not show any evidence of acute fractures or dislocations  Official radiology report(s): DG Knee Complete 4 Views Left  Result Date: 07/19/2020 CLINICAL DATA:  Knee pain for 2 months, no known injury, initial encounter EXAM: LEFT KNEE - COMPLETE 4+ VIEW COMPARISON:  None. FINDINGS: No evidence of fracture, dislocation, or joint effusion. No evidence of arthropathy or other focal bone abnormality. Soft tissues are unremarkable. IMPRESSION: No acute abnormality noted. Electronically Signed   By: Alcide Clever M.D.   On: 07/19/2020 14:38    ____________________________________________   PROCEDURES  Procedure(s) performed (including Critical Care):  Procedures   ____________________________________________   INITIAL IMPRESSION / ASSESSMENT AND PLAN / ED COURSE        Patient presents for left knee pain for the last 2 months Given history, exam and workup I have low suspicion for fracture, dislocation, significant ligamentous injury, septic arthritis, gout flare, new autoimmune arthropathy, or gonococcal arthropathy.  Interventions: 4 view x-ray of the left knee does not show any evidence of acute abnormalities Given pain on palpation of anterior plateau as well as with stress at the joint, concern for possible arthritis versus ligamentous injury.  Patient does not show any laxity of the joint.  Patient agrees with plan for discharge and follow-up with primary care physician if symptoms worsen or do not improve Disposition: Discharge home with strict return precautions and  instructions for prompt primary care follow up in the next week.      ____________________________________________   FINAL CLINICAL IMPRESSION(S) / ED DIAGNOSES  Final diagnoses:  Acute pain of left knee     ED Discharge Orders    None       Note:  This document was prepared using Dragon voice recognition software and may include unintentional dictation errors.   Merwyn Katos, MD 07/19/20 [phone]

## 2020-07-19 NOTE — Discharge Instructions (Addendum)
Por favor, [place] ibuprofen 600mg  cada 8 horas por 5 dias

## 2020-07-19 NOTE — ED Triage Notes (Signed)
Pt reports pain to left knee for 2 months. Pt reports no injuries.

## 2021-04-14 IMAGING — DX DG KNEE COMPLETE 4+V*L*
4 series · 4 of 4 positions shown · non-contrast
Comparison: None.

CLINICAL DATA: Knee pain for 2 months, no known injury, initial
encounter

EXAM:
LEFT KNEE - COMPLETE 4+ VIEW

[knee ap]
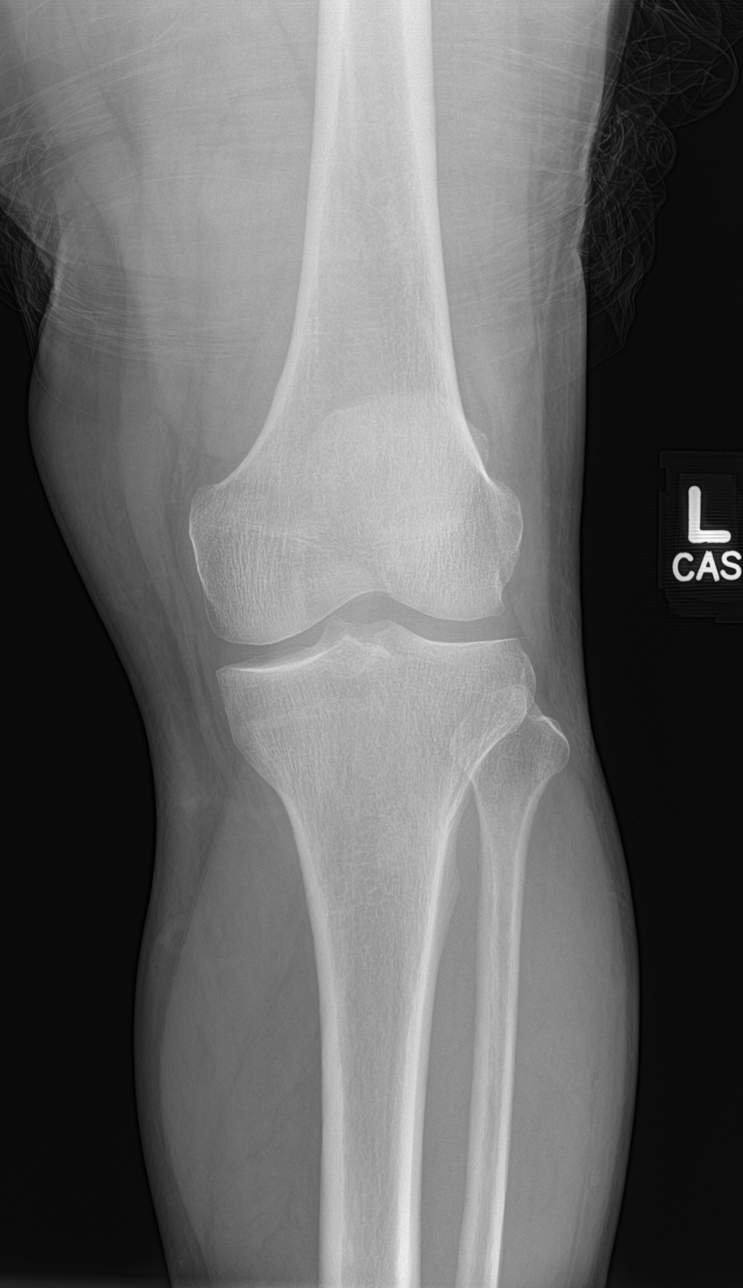

[knee lat]
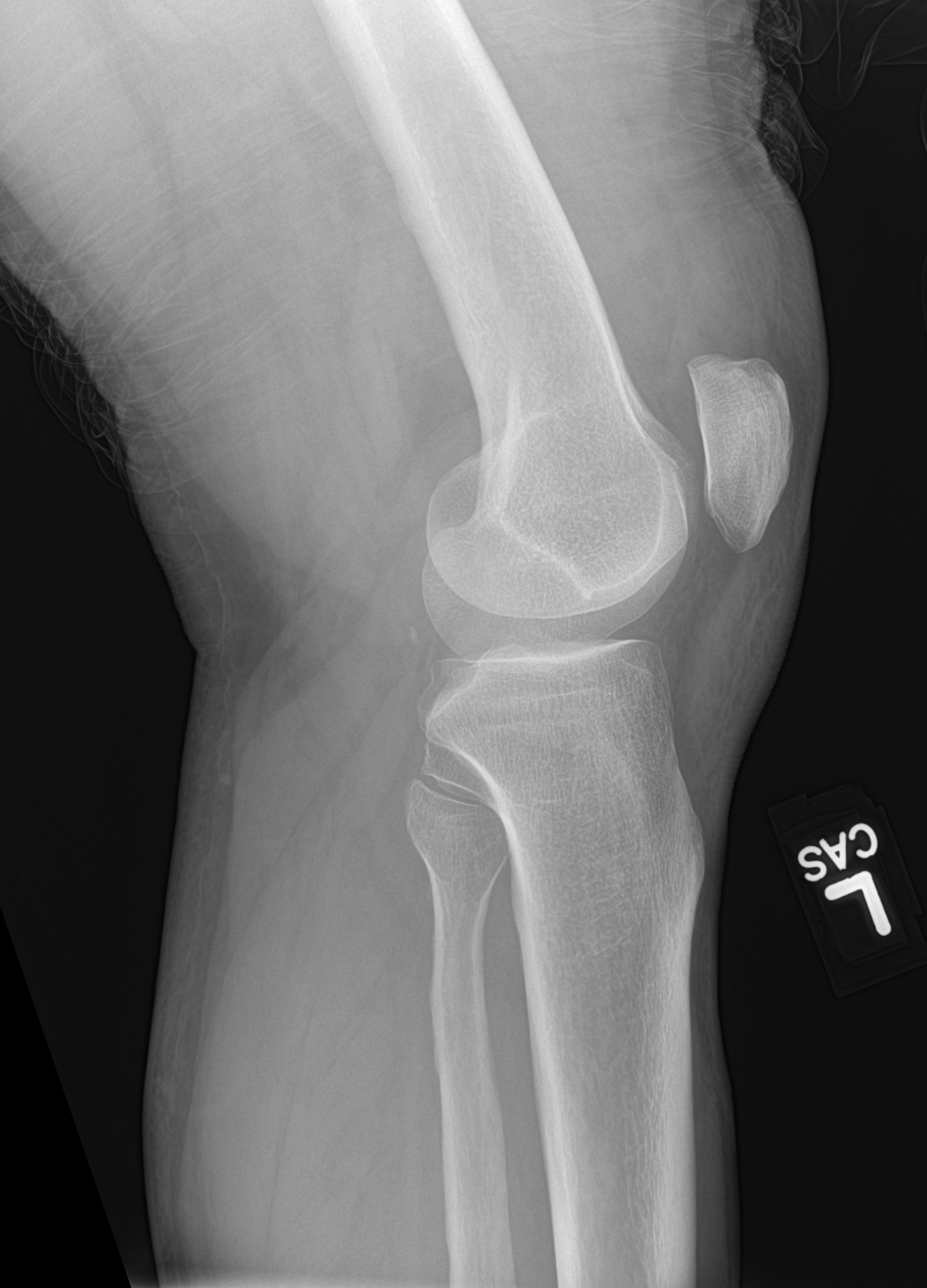

[knee obl (1 of 2)]
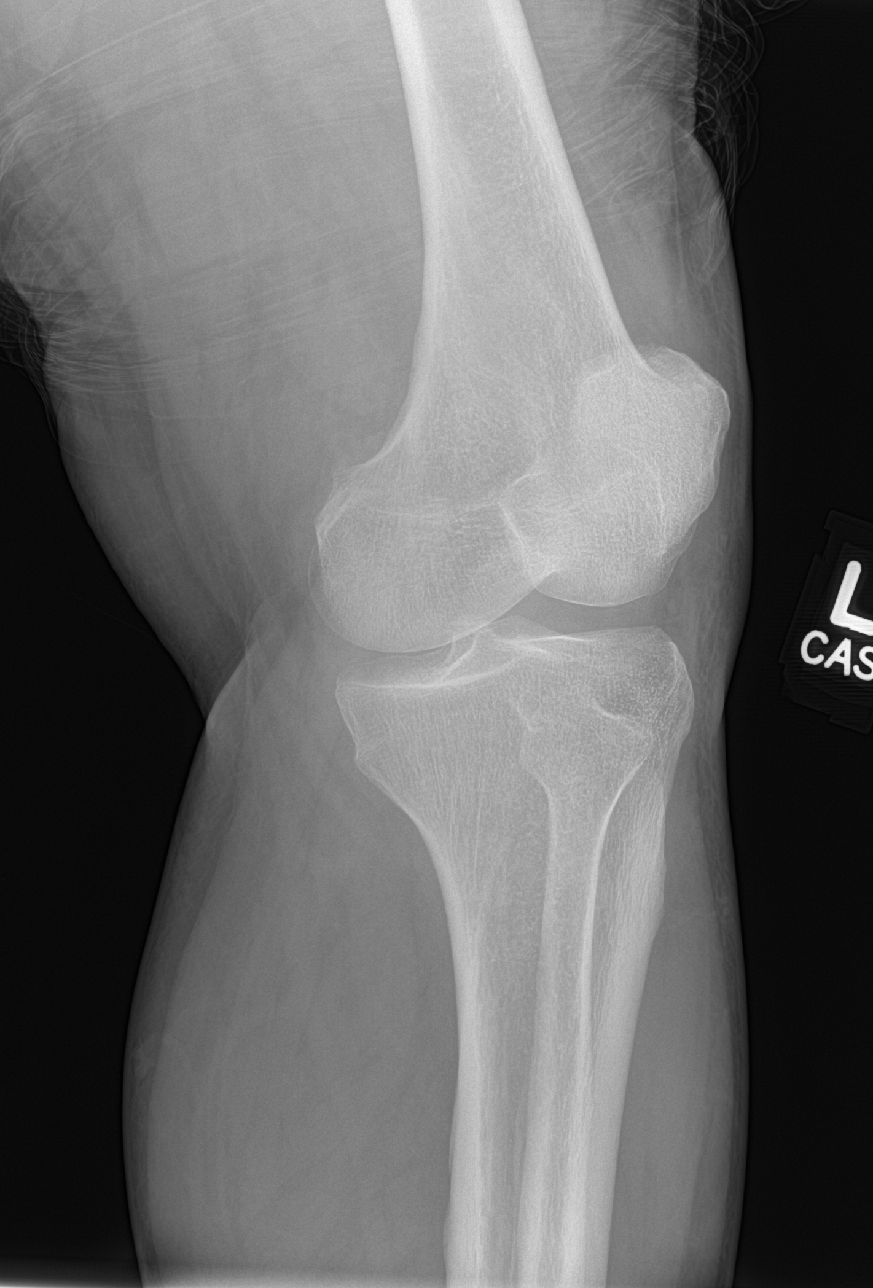

[knee obl (2 of 2)]
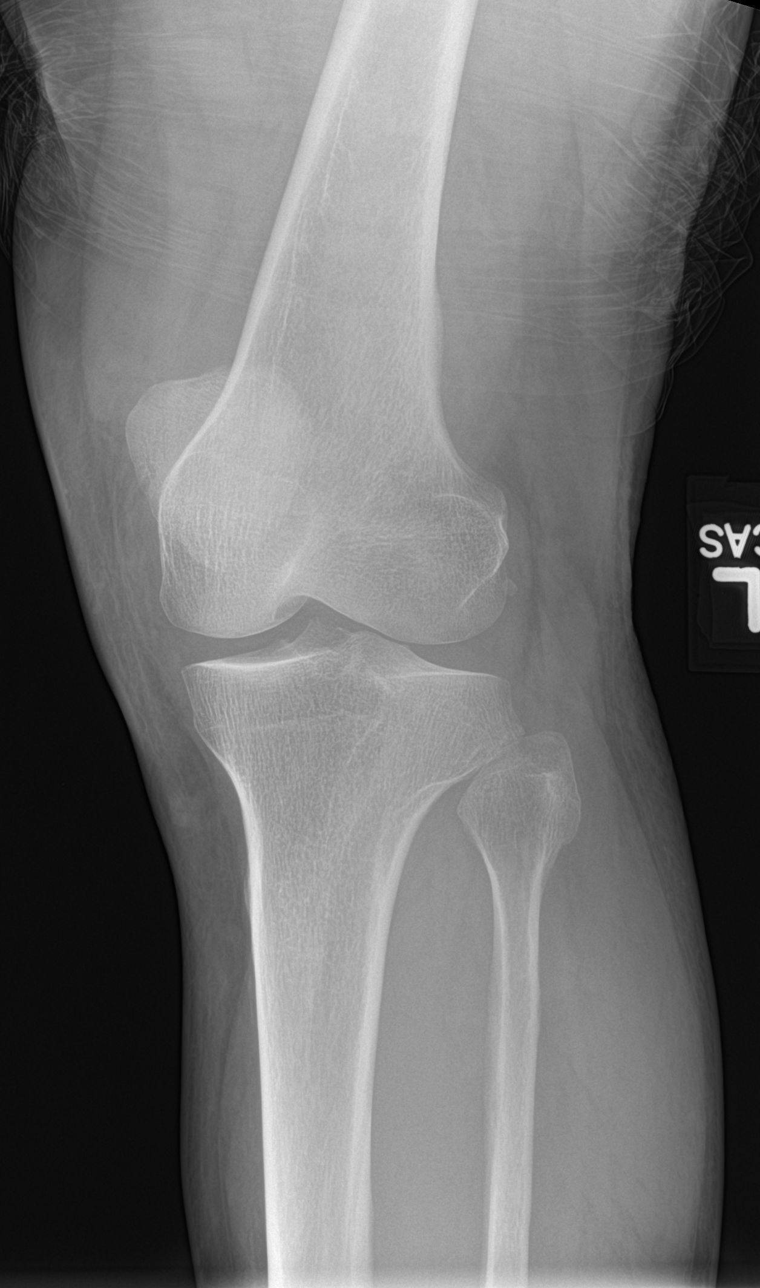

[4 of 4 positions shown; findings below may reference images not displayed]

FINDINGS: No evidence of fracture, dislocation, or joint effusion. No evidence
of arthropathy or other focal bone abnormality. Soft tissues are
unremarkable.
IMPRESSION: No acute abnormality noted.

## 2021-07-23 NOTE — Progress Notes (Signed)
07/26/21 1:51 PM   John Robles [birth year] [id number]  Referring provider:  Deborha Payment, PA-C [address] [place],  [place] [address] Chief Complaint  Patient presents with   Hematuria     HPI: John Robles is a 60 y.o.male who presents today for further evaluation of hematuria.  He is accompanied by a Engineer, structural.    He has a history of nephrolithiasis and had a renal stone study in 04/2016 that revealed a 7 mm high attenuating right renal upper pole parenchymal focus. He has no recent cross- section imaging.   Presented to urgent care for difficulty urinating 07/01/2021 urinalysis showed large blood, trace ketones, 10-50 RBC, and rare bacteria. He had a KUB but results are not viewable. He was given ciprofloxacin for presumed urinary tract infection.   His most recent PSA on 07/01/2021 was 1.47.   He is a smoker.   He is accompanied today by a spanish interpreter. He reports that he has been experiencing leakage for awhile now. He denies any burning. He reports an incident where he urinated on himself after drinking alcohol.   He states he had experienced nausea 3 days before he presented to the urgent care clinic on 07/01/2021.   He reports to have passed a kidney stone about 10 years ago. He believes that his stones are back due to the correlation of symptoms.   He reports that the antibiotics given to him helped with his symptoms.   He is longer having any pain and his urinary symptoms have completely resolved.   PMH: No past medical history on file.  Surgical History: Past Surgical History:  Procedure Laterality Date   APPENDECTOMY      Home Medications:  Allergies as of 07/24/2021   No Known Allergies      Medication List        Accurate as of July 24, 2021 11:59 PM. If you have any questions, ask your nurse or doctor.          STOP taking these medications    nystatin cream Commonly known as: MYCOSTATIN Stopped  by: Vanna Scotland, MD   nystatin powder Commonly known as: MYCOSTATIN/NYSTOP Stopped by: Vanna Scotland, MD   terbinafine 1 % cream Commonly known as: LamISIL AT Stopped by: Vanna Scotland, MD        Allergies: No Known Allergies  Family History: No family history on file.  Social History:  reports that he has been smoking cigarettes. He has never used smokeless tobacco. He reports current alcohol use. He reports that he does not use drugs.   Physical Exam: BP 113/72   Pulse (!) 49   Ht 5\' 5"  (1.651 m)   Wt 209 lb (94.8 kg)   BMI 34.78 kg/m   Constitutional:  Alert and oriented, No acute distress. HEENT:  AT, moist mucus membranes.  Trachea midline, no masses. Cardiovascular: No clubbing, cyanosis, or edema. Respiratory: Normal respiratory effort, no increased work of breathing. Skin: No rashes, bruises or suspicious lesions. Neurologic: Grossly intact, no focal deficits, moving all 4 extremities. Psychiatric: Normal mood and affect.  Laboratory Data:  Lab Results  Component Value Date   CREATININE 0.77 05/08/2016    Urinalysis - Urine negative today, no gross or microscopic blood  Pertinent Imaging: Results for orders placed or performed in visit on 07/24/21  Microscopic Examination   Urine  Result Value Ref Range   WBC, UA 0-5 0 - 5 /hpf   RBC 0-2 0 -  2 /hpf   Epithelial Cells (non renal) 0-10 0 - 10 /hpf   Bacteria, UA None seen None seen/Few  Urinalysis, Complete  Result Value Ref Range   Specific Gravity, UA 1.020 1.005 - 1.030   pH, UA 7.0 5.0 - 7.5   Color, UA Yellow Yellow   Appearance Ur Clear Clear   Leukocytes,UA Negative Negative   Protein,UA Negative Negative/Trace   Glucose, UA Negative Negative   Ketones, UA Negative Negative   RBC, UA Trace (A) Negative   Bilirubin, UA Negative Negative   Urobilinogen, Ur 0.2 0.2 - 1.0 mg/dL   Nitrite, UA Negative Negative   Microscopic Examination See below:     Assessment & Plan:     Microscopic hematuria  - possibly related to recent stone event, symptoms similar to previous stone episodes which have now resolved along with the hematuria.  - offered CT urogram versus observation he wants to pursue surveillance. Will differ CT urogram and cystoscopy for time being but recheck UA in a few months along with recheck of urinary symptoms. - will return for IPSS, PVR, and UA   2. Flank pain  - left lower quadrant  - Antibiotics given at urgent care clinic helped relieve his symptoms  - urinalysis negative no microscopic hematuria  -as above  Return in about 3 months (around 10/23/2021) for IPSS,PVR, UA. Or soner as needed  [employer] as a scribe for Vanna Scotland, MD.,have documented all relevant documentation on the behalf of Vanna Scotland, MD,as directed by  Vanna Scotland, MD while in the presence of Vanna Scotland, MD.  I have reviewed the above documentation for accuracy and completeness, and I agree with the above.   Vanna Scotland, MD   [facility] [address] [place], [place] [address] [phone]

## 2021-07-24 ENCOUNTER — Ambulatory Visit: Payer: Self-pay | Admitting: Urology

## 2021-07-24 ENCOUNTER — Encounter: Payer: Self-pay | Admitting: Urology

## 2021-07-24 ENCOUNTER — Other Ambulatory Visit: Payer: Self-pay

## 2021-07-24 ENCOUNTER — Ambulatory Visit (INDEPENDENT_AMBULATORY_CARE_PROVIDER_SITE_OTHER): Payer: Self-pay | Admitting: Urology

## 2021-07-24 VITALS — BP 113/72 | HR 49 | Ht 65.0 in | Wt 209.0 lb

## 2021-07-24 DIAGNOSIS — R319 Hematuria, unspecified: Secondary | ICD-10-CM

## 2021-07-24 LAB — URINALYSIS, COMPLETE
Bilirubin, UA: NEGATIVE
Glucose, UA: NEGATIVE
Ketones, UA: NEGATIVE
Leukocytes,UA: NEGATIVE
Nitrite, UA: NEGATIVE
Protein,UA: NEGATIVE
Specific Gravity, UA: 1.02 (ref 1.005–1.030)
Urobilinogen, Ur: 0.2 mg/dL (ref 0.2–1.0)
pH, UA: 7 (ref 5.0–7.5)

## 2021-07-24 LAB — MICROSCOPIC EXAMINATION: Bacteria, UA: NONE SEEN

## 2021-07-24 NOTE — Progress Notes (Signed)
See below

## 2021-10-23 ENCOUNTER — Ambulatory Visit: Payer: Medicaid Other | Admitting: Urology

## 2021-10-23 ENCOUNTER — Ambulatory Visit: Payer: Medicaid Other | Admitting: Physician Assistant

## 2021-10-23 NOTE — Progress Notes (Incomplete)
° °  10/23/21 8:30 AM   John Robles 07/08/1961 [id number]  Referring provider:  No referring provider defined for this encounter. No chief complaint on file.    HPI: John Robles is a 60 y.o.male with a personal history of microscopic hematuria, nephrolithiasis, and flank pain, who presents today for 3 month follow-up with IPSS, PVR, and UA.   He had a renal stone study in 04/2016 that revealed a 7 mm high attenuating right renal upper pole parenchymal focus.   His most recent PSA on 07/01/2021 was 1.47.    He is a smoker.   PMH: No past medical history on file.  Surgical History: Past Surgical History:  Procedure Laterality Date   APPENDECTOMY      Home Medications:  Allergies as of 10/23/2021   No Known Allergies      Medication List    as of October 23, 2021  8:30 AM   You have not been prescribed any medications.     Allergies: No Known Allergies  Family History: No family history on file.  Social History:  reports that he has been smoking cigarettes. He has never used smokeless tobacco. He reports current alcohol use. He reports that he does not use drugs.   Physical Exam: There were no vitals taken for this visit.  Constitutional:  Alert and oriented, No acute distress. HEENT: [place] AT, moist mucus membranes.  Trachea midline, no masses. Cardiovascular: No clubbing, cyanosis, or edema. Respiratory: Normal respiratory effort, no increased work of breathing. Skin: No rashes, bruises or suspicious lesions. Neurologic: Grossly intact, no focal deficits, moving all 4 extremities. Psychiatric: Normal mood and affect.  Laboratory Data:  Lab Results  Component Value Date   CREATININE 0.77 05/08/2016    Urinalysis   Pertinent Imaging:    Assessment & Plan:     No follow-ups on file.  I,[name],acting as a scribe for Vanna Scotland, MD.,have documented all relevant documentation on the behalf of Vanna Scotland, MD,as  directed by  Vanna Scotland, MD while in the presence of Vanna Scotland, MD.   [facility] [address], [address] [place], [place] [address] [phone]'

## 2021-11-11 NOTE — Progress Notes (Signed)
11/12/21 10:10 AM   John Robles 10-15-1961 [id number]  Referring provider:  No referring provider defined for this encounter. Chief Complaint  Patient presents with   Hematuria     HPI: John Robles is a 61 y.o.male with a personal history of  microscopic hematuria and flank pain who presents today for 3 month follow-up with IPSS, PVR, and UA for further evaluation of hematuria.   Back in August, he was having some difficulty urinating, had some microscopic hematuria and felt like he was probably passing a kidney stone.  He did not have any imaging at the time.  His symptoms quickly subsided after he was treated for presumed UTI.  He had several repeat urinalysis since that acute episode including when he was seen by me in September, recent ER visit in December and again today all of which show no further microscopic hematuria.  He elected to forego hematuria evaluation.  His most recent PSA on 07/01/2021 was 1.47.   In interim he went to [facility] and was diagnosed with diverticulitis on 10/29/2021 . Urinalysis was negative. CT abdomen and pelvis revealed No hydronephrosis.  Scattered bilateral renal cysts, largest in the left interpolar region measuring up to 3.5 cm, partially exophytic. There are additional low-attenuation lesions, too small to characterize. Acute uncomplicated distal descending/proximal sigmoid colonic diverticulitis.   He is accompanied today by a spanish interpreter. He is interested in prostate health today. He reports that he has conversations with his male friends and they told him he should look into his prostate.    IPSS     Row Name 11/12/21 1000         International Prostate Symptom Score   How often have you had the sensation of not emptying your bladder? Not at All     How often have you had to urinate less than every two hours? Less than half the time     How often have you found you stopped and started again several times  when you urinated? Not at All     How often have you found it difficult to postpone urination? Not at All     How often have you had a weak urinary stream? Not at All     How often have you had to strain to start urination? Less than half the time     How many times did you typically get up at night to urinate? 1 Time     Total IPSS Score 5       Quality of Life due to urinary symptoms   If you were to spend the rest of your life with your urinary condition just the way it is now how would you feel about that? Mostly Satisfied              Score:  1-7 Mild 8-19 Moderate 20-35 Severe  PMH: History reviewed. No pertinent past medical history.  Surgical History: Past Surgical History:  Procedure Laterality Date   APPENDECTOMY      Home Medications:  Allergies as of 11/12/2021   No Known Allergies      Medication List    as of November 12, 2021 10:10 AM   You have not been prescribed any medications.     Allergies: No Known Allergies  Family History: Family History  Problem Relation Age of Onset   Prostate cancer Neg Hx    Testicular cancer Neg Hx    Kidney cancer Neg Hx  Bladder Cancer Neg Hx     Social History:  reports that he has been smoking cigarettes. He has never used smokeless tobacco. He reports current alcohol use. He reports that he does not use drugs.   Physical Exam: BP 111/70    Pulse (!) 50    Ht 5\' 5"  (1.651 m)    Wt 211 lb (95.7 kg)    BMI 35.11 kg/m   Constitutional:  Alert and oriented, No acute distress. HEENT: [place] AT, moist mucus membranes.  Trachea midline, no masses. Cardiovascular: No clubbing, cyanosis, or edema. Respiratory: Normal respiratory effort, no increased work of breathing. Skin: No rashes, bruises or suspicious lesions. Neurologic: Grossly intact, no focal deficits, moving all 4 extremities. Psychiatric: Normal mood and affect.  Laboratory Data:  Lab Results  Component Value Date   CREATININE 0.77 05/08/2016     Urinalysis - Urinalysis showed no blood  Pertinent Imaging: CLINICAL INDICATION: 61 years old with llq abdominal pain     COMPARISON: CT abdomen pelvis 12/26/2005   TECHNIQUE: A helical CT scan of the abdomen and pelvis was obtained following IV contrast from the lung bases through the pubic symphysis. Images were reconstructed in the axial plane. Coronal and sagittal reformatted images were also provided for further evaluation.    FINDINGS:   LOWER CHEST: Minimal atelectasis at the lung bases. No pleural effusions.   LIVER: Normal liver contour.  Subcentimeter low-attenuation lesion in the anterior left lobe of the liver (series 2#28), too small to characterize.   BILIARY: No biliary ductal dilatation.  The gallbladder is normal in appearance.   SPLEEN: Normal in size and contour. Tiny adjacent splenules.   PANCREAS: Normal pancreatic contour.  No focal lesions.  No ductal dilation.   ADRENAL GLANDS: Normal appearance of the adrenal glands.   KIDNEYS/URETERS: Symmetric renal enhancement.  No hydronephrosis.  Scattered bilateral renal cysts, largest in the left interpolar region measuring up to 3.5 cm, partially exophytic. There are additional low-attenuation lesions, too small to characterize.   BLADDER: Unremarkable.   REPRODUCTIVE ORGANS: Mild prostatomegaly measuring up to 5.2 cm   GI TRACT: The stomach appears unremarkable. The loops of small bowel are normal in caliber. There is extensive colonic diverticulosis with a focal area of fat stranding and surrounding inflammatory changes in the left lower quadrant (2:118). No evidence of perforation or adjacent rim-enhancing fluid to suggest abscess formation.   PERITONEUM, RETROPERITONEUM AND MESENTERY: No free air.  No ascites.     LYMPH NODES: No adenopathy.   VESSELS: Hepatic and portal veins are patent.  Normal caliber aorta.  No significant calcified atherosclerotic disease. Retroaortic left renal vein   BONES and  SOFT TISSUES: There are degenerative changes of the spine. Small fat-containing umbilical hernia.   IMPRESSION:  Acute uncomplicated distal descending/proximal sigmoid colonic diverticulitis. Colonoscopy following resolution of acute symptoms can be obtained as clinically indicated to exclude underlying mass lesion.   Additional chronic and incidental findings as noted above.  Results for orders placed or performed in visit on 11/12/21  BLADDER SCAN AMB NON-IMAGING  Result Value Ref Range   Scan Result 0 ml    Assessment & Plan:    Microscopic hematuria  - Recent urinalysis showed no evidence of hematuria. Urinalysis today shows no blood also.  - Likely secondary to previous stone event vs infection which has now resolved; negative urinalysis x3 which is very reassuring -PSA screening up-to-date, no urinary tract symptoms, reassured about his overall GU health especially in light of  the above CT scan which was reviewed  2. Diverticulitis  - Discussed that he should follow-up with his PCP he has yet to do this to further discuss this and colonoscopy.  - Discussed the importance of a colonoscopy due to his age as well as this recent infection. He was given written information about colonoscopy today.   Follow-up as needed if he has any further urinary symptoms, recurrent gross or microscopic hematuria which I would recommend cystoscopy  I,[name],acting as a scribe for Hollice Espy, MD.,have documented all relevant documentation on the behalf of Hollice Espy, MD,as directed by  Hollice Espy, MD while in the presence of Hollice Espy, MD.  I have reviewed the above documentation for accuracy and completeness, and I agree with the above.   Hollice Espy, MD   [facility] [address] [place], [place] [address] [phone]

## 2021-11-12 ENCOUNTER — Encounter: Payer: Self-pay | Admitting: Urology

## 2021-11-12 ENCOUNTER — Ambulatory Visit (INDEPENDENT_AMBULATORY_CARE_PROVIDER_SITE_OTHER): Payer: Self-pay | Admitting: Urology

## 2021-11-12 ENCOUNTER — Other Ambulatory Visit: Payer: Self-pay

## 2021-11-12 VITALS — BP 111/70 | HR 50 | Ht 65.0 in | Wt 211.0 lb

## 2021-11-12 DIAGNOSIS — R319 Hematuria, unspecified: Secondary | ICD-10-CM

## 2021-11-12 LAB — URINALYSIS, COMPLETE
Bilirubin, UA: NEGATIVE
Glucose, UA: NEGATIVE
Ketones, UA: NEGATIVE
Leukocytes,UA: NEGATIVE
Nitrite, UA: NEGATIVE
Protein,UA: NEGATIVE
Specific Gravity, UA: 1.025 (ref 1.005–1.030)
Urobilinogen, Ur: 1 mg/dL (ref 0.2–1.0)
pH, UA: 6.5 (ref 5.0–7.5)

## 2021-11-12 LAB — MICROSCOPIC EXAMINATION
Bacteria, UA: NONE SEEN
Epithelial Cells (non renal): NONE SEEN /hpf (ref 0–10)
WBC, UA: NONE SEEN /hpf (ref 0–5)

## 2021-11-12 LAB — BLADDER SCAN AMB NON-IMAGING: Scan Result: 0

## 2021-11-12 NOTE — Patient Instructions (Signed)
Colonoscopa en los adultos Colonoscopy, Adult [place] colonoscopa es un examen que se realiza para examinar el intestino grueso. Se realiza [employer] de un tubo [place], fino y flexible que tiene una cmara en el extremo. Este examen se realiza para detectar si hay problemas, por ejemplo: Un crecimiento anormal de clulas o tejido (tumor). Crecimientos anormales dentro del revestimiento del intestino (plipos). Irritacin e hinchazn(inflamacin). Sangrado. Consulte al mdico acerca de lo siguiente: Cualquier alergia que tenga. Todos los [employer] toma. Infrmele sobre vitaminas, hierbas, gotas oftlmicas, cremas y [address] de [address]. Problemas previos que usted o algn miembro de su familia hayan tenido con los anestsicos. Cualquier problema de la sangre que tenga. Cirugas a las que se haya sometido. Cualquier afeccin mdica que tenga. Cualquier problema que haya tenido al defecar ( deposiciones). Si est embarazada o podra estarlo. Cules son los riesgos? En general, se trata de un procedimiento seguro. Sin embargo, pueden presentarse problemas, por ejemplo: Sangrado. Dao intestinal. Reacciones alrgicas a los medicamentos administrados durante el procedimiento. Infeccin. Esto es poco frecuente. Qu ocurre antes del procedimiento? Comida y bebida Siga las instrucciones del mdico respecto de las comidas y las bebidas. Pueden incluir: [employer] del procedimiento: Siga una dieta con bajo contenido de [place]. Evite estos alimentos: Frutos secos. Semillas. Frutas pasas. Frutas crudas. Vegetales. Entre 1 y 3 das antes del procedimiento: Coma solo postre de gelatina o helados de [place]. Beba solamente lquidos transparentes, por ejemplo: Agua. Caldos o sopas transparentes. Caf negro o t. Jugos transparentes. Refrescos o bebidas deportivas transparentes. No beba lquidos con colorante rojo o morado. El da del procedimiento: No coma alimentos  slidos. Puede continuar bebiendo lquidos claros hasta 2 horas antes del procedimiento. No coma ni beba nada a partir de 2 horas antes al procedimiento o segn le haya indicado el mdico. Preparado intestinal Si le recetaron un preparado intestinal para tomar por la boca (por va oral) para limpiar el colon: Tmelo como se lo haya indicado el mdico. A partir del da anterior al procedimiento, tendr que beber una gran cantidad de un medicamento lquido. El lquido lo har defecar hasta que la materia fecal sea casi transparente o de color verde claro. Si la piel o la zona anal se le irritan debido a Technical sales engineer, Scientist, product/process development lo siguiente: Limpie la zona con toallitas que contengan productos medicinales, por ejemplo, toallitas hmedas para adultos con aloe y vitamina E. Aplquese un producto en la piel que suavice la zona, como vaselina. Si vomita mientras toma el preparado intestinal: Haga una pausa durante 60 minutos como mximo. Comience a tomar el preparado nuevamente. Llame al mdico si sigue vomitando y no puede tomar el preparado intestinal sin vomitar. Para limpiarle el colon, tambin pueden darle: Medicamentos laxantes. Estos ayudan a defecar. Instrucciones para el uso de un medicamento lquido (enema) inyectado en el ano. Medicamentos Consulte al mdico si debe cambiar o suspender: Sus medicamentos habituales. Vitaminas, hierbas y suplementos. Medicamentos de [employer]. No tome aspirina ni ibuprofeno a menos que se lo indiquen. Instrucciones generales Pregntele al mdico qu medidas se tomarn para evitar la propagacin de grmenes. Estas pueden incluir lavar la piel con un jabn para eliminar los grmenes. Si va a marcharse a su casa inmediatamente despus del procedimiento, pdale a un adulto responsable que: Lo lleve a su casa desde el hospital o la clnica. No se le permitir conducir. Lo cuide durante el [employer] indiquen. Qu ocurre durante el procedimiento?  Se le  colocar  un tubo (catter) intravenoso en una vena. Es posible que le administren lo siguiente: Un sedante. Este medicamento ayuda a Lexicographer. Anestsicos. Estos medicamentos: Adormecen ciertas zonas del cuerpo. Hacen que se duerma durante el procedimiento. Esto debe realizarse en contadas ocasiones. Se recostar de costado con las rodillas flexionadas. Se colocar aceite o gel sobre el tubo. Luego, se har lo siguiente con el tubo: Se colocar en la abertura del ano. Se introducir suavemente en el intestino grueso. Le aplicarn aire en el colon para mantenerlo abierto. Es posible que sienta presin o clicos. La cmara se utilizar para tomar fotos que Insurance underwriter. Podrn tomarle una pequea muestra de tejido (biopsia) para examinarla. Si se encuentran pequeos crecimientos, el mdico puede extirparlos y analizarlos para Landscape architect presencia de cncer. El tubo se extraer lentamente. Este procedimiento puede variar segn el mdico y el hospital. [place] ocurre despus del procedimiento? Lo controlarn hasta que deje el hospital o la [place]. Esto incluye controlar la presin arterial, la frecuencia cardaca y Air traffic controller, y el nivel de oxgeno en la sangre. Es posible que encuentre una pequea cantidad de sangre en la materia fecal. Puede eliminar gases. Puede sentir clicos leves o distensin en el abdomen. Si le administraron un sedante durante el procedimiento, no conduzca ni use mquinas hasta que el [employer] indique que es seguro [place]. Es su responsabilidad retirar [employer] del procedimiento. Pregunte cmo obtener sus resultados cuando estn listos. Resumen [place] colonoscopa es un examen que se realiza para examinar el intestino grueso. Siga las instrucciones de su mdico acerca de comer y beber antes del procedimiento. Es posible que le receten un preparado intestinal por va oral para limpiar el colon. Tmelo como se lo haya indicado el mdico. Se  introducir un tubo flexible con una cmara en su extremo en el orificio del ano. Se introducir en el intestino grueso. Esta informacin no tiene Theme park manager el consejo del mdico. Asegrese de hacerle al mdico cualquier pregunta que tenga. Document Revised: 07/10/2021 Document Reviewed: 07/10/2021 Elsevier Patient Education  2022 [employer].

## 2022-07-11 DIAGNOSIS — Z419 Encounter for procedure for purposes other than remedying health state, unspecified: Secondary | ICD-10-CM | POA: Diagnosis not present

## 2022-08-10 DIAGNOSIS — Z419 Encounter for procedure for purposes other than remedying health state, unspecified: Secondary | ICD-10-CM | POA: Diagnosis not present

## 2022-09-10 DIAGNOSIS — Z419 Encounter for procedure for purposes other than remedying health state, unspecified: Secondary | ICD-10-CM | POA: Diagnosis not present

## 2022-10-10 DIAGNOSIS — Z419 Encounter for procedure for purposes other than remedying health state, unspecified: Secondary | ICD-10-CM | POA: Diagnosis not present

## 2022-11-10 DIAGNOSIS — Z419 Encounter for procedure for purposes other than remedying health state, unspecified: Secondary | ICD-10-CM | POA: Diagnosis not present

## 2022-12-11 DIAGNOSIS — Z419 Encounter for procedure for purposes other than remedying health state, unspecified: Secondary | ICD-10-CM | POA: Diagnosis not present

## 2023-01-09 DIAGNOSIS — Z419 Encounter for procedure for purposes other than remedying health state, unspecified: Secondary | ICD-10-CM | POA: Diagnosis not present

## 2023-02-09 DIAGNOSIS — Z419 Encounter for procedure for purposes other than remedying health state, unspecified: Secondary | ICD-10-CM | POA: Diagnosis not present

## 2023-03-10 ENCOUNTER — Telehealth: Payer: Self-pay

## 2023-03-10 NOTE — Telephone Encounter (Signed)
LVM for patient to call back. AS, CMA 

## 2023-03-11 DIAGNOSIS — Z419 Encounter for procedure for purposes other than remedying health state, unspecified: Secondary | ICD-10-CM | POA: Diagnosis not present

## 2023-04-11 DIAGNOSIS — Z419 Encounter for procedure for purposes other than remedying health state, unspecified: Secondary | ICD-10-CM | POA: Diagnosis not present

## 2023-05-11 DIAGNOSIS — Z419 Encounter for procedure for purposes other than remedying health state, unspecified: Secondary | ICD-10-CM | POA: Diagnosis not present

## 2023-05-25 DIAGNOSIS — H5203 Hypermetropia, bilateral: Secondary | ICD-10-CM | POA: Diagnosis not present

## 2023-05-25 DIAGNOSIS — H5213 Myopia, bilateral: Secondary | ICD-10-CM | POA: Diagnosis not present

## 2023-06-11 DIAGNOSIS — Z419 Encounter for procedure for purposes other than remedying health state, unspecified: Secondary | ICD-10-CM | POA: Diagnosis not present

## 2023-07-12 DIAGNOSIS — Z419 Encounter for procedure for purposes other than remedying health state, unspecified: Secondary | ICD-10-CM | POA: Diagnosis not present

## 2023-08-11 DIAGNOSIS — Z419 Encounter for procedure for purposes other than remedying health state, unspecified: Secondary | ICD-10-CM | POA: Diagnosis not present

## 2023-09-04 DIAGNOSIS — Z131 Encounter for screening for diabetes mellitus: Secondary | ICD-10-CM | POA: Diagnosis not present

## 2023-09-04 DIAGNOSIS — E785 Hyperlipidemia, unspecified: Secondary | ICD-10-CM | POA: Diagnosis not present

## 2023-09-04 DIAGNOSIS — Z0000 Encounter for general adult medical examination without abnormal findings: Secondary | ICD-10-CM | POA: Diagnosis not present

## 2023-09-04 DIAGNOSIS — Z1331 Encounter for screening for depression: Secondary | ICD-10-CM | POA: Diagnosis not present

## 2023-09-04 DIAGNOSIS — Z1389 Encounter for screening for other disorder: Secondary | ICD-10-CM | POA: Diagnosis not present

## 2023-09-04 DIAGNOSIS — R7303 Prediabetes: Secondary | ICD-10-CM | POA: Diagnosis not present

## 2023-09-04 DIAGNOSIS — E669 Obesity, unspecified: Secondary | ICD-10-CM | POA: Diagnosis not present

## 2023-09-11 DIAGNOSIS — Z419 Encounter for procedure for purposes other than remedying health state, unspecified: Secondary | ICD-10-CM | POA: Diagnosis not present

## 2023-09-21 DIAGNOSIS — Z1212 Encounter for screening for malignant neoplasm of rectum: Secondary | ICD-10-CM | POA: Diagnosis not present

## 2023-09-21 DIAGNOSIS — Z1211 Encounter for screening for malignant neoplasm of colon: Secondary | ICD-10-CM | POA: Diagnosis not present

## 2023-09-24 ENCOUNTER — Ambulatory Visit: Payer: Medicaid Other | Admitting: Nurse Practitioner

## 2023-10-11 DIAGNOSIS — Z419 Encounter for procedure for purposes other than remedying health state, unspecified: Secondary | ICD-10-CM | POA: Diagnosis not present

## 2023-11-11 DIAGNOSIS — Z419 Encounter for procedure for purposes other than remedying health state, unspecified: Secondary | ICD-10-CM | POA: Diagnosis not present

## 2023-12-12 DIAGNOSIS — Z419 Encounter for procedure for purposes other than remedying health state, unspecified: Secondary | ICD-10-CM | POA: Diagnosis not present

## 2024-01-09 DIAGNOSIS — Z419 Encounter for procedure for purposes other than remedying health state, unspecified: Secondary | ICD-10-CM | POA: Diagnosis not present

## 2024-02-20 DIAGNOSIS — Z419 Encounter for procedure for purposes other than remedying health state, unspecified: Secondary | ICD-10-CM | POA: Diagnosis not present

## 2024-02-22 DIAGNOSIS — Z125 Encounter for screening for malignant neoplasm of prostate: Secondary | ICD-10-CM | POA: Diagnosis not present

## 2024-02-22 DIAGNOSIS — R399 Unspecified symptoms and signs involving the genitourinary system: Secondary | ICD-10-CM | POA: Diagnosis not present

## 2024-02-22 DIAGNOSIS — Z113 Encounter for screening for infections with a predominantly sexual mode of transmission: Secondary | ICD-10-CM | POA: Diagnosis not present

## 2024-02-22 DIAGNOSIS — R945 Abnormal results of liver function studies: Secondary | ICD-10-CM | POA: Diagnosis not present

## 2024-02-22 DIAGNOSIS — Z1389 Encounter for screening for other disorder: Secondary | ICD-10-CM | POA: Diagnosis not present

## 2024-03-21 DIAGNOSIS — Z419 Encounter for procedure for purposes other than remedying health state, unspecified: Secondary | ICD-10-CM | POA: Diagnosis not present

## 2024-04-21 DIAGNOSIS — Z419 Encounter for procedure for purposes other than remedying health state, unspecified: Secondary | ICD-10-CM | POA: Diagnosis not present

## 2024-05-21 DIAGNOSIS — Z419 Encounter for procedure for purposes other than remedying health state, unspecified: Secondary | ICD-10-CM | POA: Diagnosis not present

## 2024-06-21 DIAGNOSIS — Z419 Encounter for procedure for purposes other than remedying health state, unspecified: Secondary | ICD-10-CM | POA: Diagnosis not present

## 2024-07-22 DIAGNOSIS — Z419 Encounter for procedure for purposes other than remedying health state, unspecified: Secondary | ICD-10-CM | POA: Diagnosis not present
